# Patient Record
Sex: Female | Born: 1965 | Race: White | Hispanic: No | Smoking: Current every day smoker
Health system: Southern US, Community
[De-identification: ages and names within clinical notes are randomized; demographics above are authoritative.]

## PROBLEM LIST (undated history)

## (undated) DIAGNOSIS — J45909 Unspecified asthma, uncomplicated: Secondary | ICD-10-CM

## (undated) HISTORY — PX: NO PAST SURGERIES: SHX2092

---

## 2005-03-19 ENCOUNTER — Emergency Department: Payer: Self-pay | Admitting: General Practice

## 2005-10-04 ENCOUNTER — Ambulatory Visit: Payer: Self-pay

## 2005-10-22 ENCOUNTER — Observation Stay: Payer: Self-pay

## 2005-11-05 ENCOUNTER — Observation Stay: Payer: Self-pay | Admitting: Obstetrics & Gynecology

## 2005-11-06 ENCOUNTER — Inpatient Hospital Stay: Payer: Self-pay | Admitting: Obstetrics & Gynecology

## 2005-11-14 ENCOUNTER — Emergency Department: Payer: Self-pay | Admitting: Emergency Medicine

## 2007-01-04 ENCOUNTER — Other Ambulatory Visit: Payer: Self-pay

## 2007-01-04 ENCOUNTER — Emergency Department: Payer: Self-pay | Admitting: Emergency Medicine

## 2007-07-13 ENCOUNTER — Emergency Department: Payer: Self-pay | Admitting: Emergency Medicine

## 2008-06-21 ENCOUNTER — Emergency Department: Payer: Self-pay | Admitting: Emergency Medicine

## 2009-12-24 ENCOUNTER — Emergency Department: Payer: Self-pay | Admitting: Emergency Medicine

## 2011-03-03 ENCOUNTER — Emergency Department: Payer: Self-pay | Admitting: Internal Medicine

## 2012-04-23 ENCOUNTER — Emergency Department: Payer: Self-pay | Admitting: Internal Medicine

## 2012-04-23 LAB — CBC
HCT: 43.1 % (ref 35.0–47.0)
Platelet: 333 10*3/uL (ref 150–440)
RBC: 4.81 10*6/uL (ref 3.80–5.20)

## 2012-04-23 LAB — DRUG SCREEN, URINE
Barbiturates, Ur Screen: NEGATIVE (ref ?–200)
Cocaine Metabolite,Ur ~~LOC~~: NEGATIVE (ref ?–300)
MDMA (Ecstasy)Ur Screen: NEGATIVE (ref ?–500)
Methadone, Ur Screen: NEGATIVE (ref ?–300)
Opiate, Ur Screen: NEGATIVE (ref ?–300)
Phencyclidine (PCP) Ur S: NEGATIVE (ref ?–25)

## 2012-04-23 LAB — COMPREHENSIVE METABOLIC PANEL
Albumin: 4.7 g/dL (ref 3.4–5.0)
Alkaline Phosphatase: 82 U/L (ref 50–136)
BUN: 12 mg/dL (ref 7–18)
Bilirubin,Total: 0.5 mg/dL (ref 0.2–1.0)
Calcium, Total: 9.7 mg/dL (ref 8.5–10.1)
Chloride: 105 mmol/L (ref 98–107)
Creatinine: 0.97 mg/dL (ref 0.60–1.30)
EGFR (Non-African Amer.): >60
Osmolality: 273 (ref 275–301)
SGOT(AST): 17 U/L (ref 15–37)
SGPT (ALT): 23 U/L (ref 12–78)
Sodium: 137 mmol/L (ref 136–145)
Total Protein: 8.4 g/dL — ABNORMAL HIGH (ref 6.4–8.2)

## 2012-04-23 LAB — CK TOTAL AND CKMB (NOT AT ARMC): CK-MB: <0.5 ng/mL — ABNORMAL LOW (ref 0.5–3.6)

## 2013-02-08 ENCOUNTER — Other Ambulatory Visit: Payer: Self-pay | Admitting: Podiatry

## 2013-02-08 ENCOUNTER — Telehealth: Payer: Self-pay | Admitting: *Deleted

## 2013-02-08 NOTE — Telephone Encounter (Signed)
Pt called requesting xanax .5 mg

## 2013-02-08 NOTE — Telephone Encounter (Signed)
She can have xanax refill

## 2013-02-09 MED ORDER — ALPRAZOLAM 0.5 MG PO TABS: 0.5000 mg | ORAL_TABLET | Freq: Three times a day (TID) | ORAL | Status: DC | PRN

## 2013-02-09 NOTE — Telephone Encounter (Signed)
Xanax approved by dr. Charlsie Merles

## 2015-03-02 ENCOUNTER — Emergency Department: Payer: Medicare Other

## 2015-03-02 ENCOUNTER — Inpatient Hospital Stay
Admission: EM | Admit: 2015-03-02 | Discharge: 2015-03-04 | DRG: 871 | Disposition: A | Discharge status: Home / Self Care | Payer: Medicare Other | Attending: Internal Medicine | Admitting: Internal Medicine

## 2015-03-02 ENCOUNTER — Encounter: Payer: Self-pay | Admitting: Emergency Medicine

## 2015-03-02 DIAGNOSIS — D72829 Elevated white blood cell count, unspecified: Secondary | ICD-10-CM | POA: Diagnosis not present

## 2015-03-02 DIAGNOSIS — A419 Sepsis, unspecified organism: Principal | ICD-10-CM | POA: Diagnosis present

## 2015-03-02 DIAGNOSIS — J45909 Unspecified asthma, uncomplicated: Secondary | ICD-10-CM | POA: Diagnosis present

## 2015-03-02 DIAGNOSIS — J189 Pneumonia, unspecified organism: Secondary | ICD-10-CM | POA: Diagnosis present

## 2015-03-02 DIAGNOSIS — R000 Tachycardia, unspecified: Secondary | ICD-10-CM

## 2015-03-02 DIAGNOSIS — Z885 Allergy status to narcotic agent status: Secondary | ICD-10-CM | POA: Insufficient documentation

## 2015-03-02 DIAGNOSIS — R509 Fever, unspecified: Secondary | ICD-10-CM

## 2015-03-02 DIAGNOSIS — F17210 Nicotine dependence, cigarettes, uncomplicated: Secondary | ICD-10-CM | POA: Diagnosis present

## 2015-03-02 DIAGNOSIS — M546 Pain in thoracic spine: Secondary | ICD-10-CM | POA: Diagnosis present

## 2015-03-02 DIAGNOSIS — Z886 Allergy status to analgesic agent status: Secondary | ICD-10-CM | POA: Insufficient documentation

## 2015-03-02 DIAGNOSIS — Z23 Encounter for immunization: Secondary | ICD-10-CM | POA: Insufficient documentation

## 2015-03-02 HISTORY — DX: Unspecified asthma, uncomplicated: J45.909

## 2015-03-02 LAB — COMPREHENSIVE METABOLIC PANEL
ALBUMIN: 4.1 g/dL (ref 3.5–5.0)
ALK PHOS: 90 U/L (ref 38–126)
ALT: 12 U/L — AB (ref 14–54)
AST: 14 U/L — AB (ref 15–41)
Anion gap: 11 (ref 5–15)
BILIRUBIN TOTAL: 0.9 mg/dL (ref 0.3–1.2)
BUN: 9 mg/dL (ref 6–20)
CALCIUM: 9.1 mg/dL (ref 8.9–10.3)
CO2: 21 mmol/L — AB (ref 22–32)
CREATININE: 1.11 mg/dL — AB (ref 0.44–1.00)
Chloride: 105 mmol/L (ref 101–111)
GFR calc Af Amer: >60 mL/min (ref >60)
GFR calc non Af Amer: 57 mL/min — ABNORMAL LOW (ref >60)
GLUCOSE: 99 mg/dL (ref 65–99)
Potassium: 4.1 mmol/L (ref 3.5–5.1)
Sodium: 137 mmol/L (ref 135–145)
TOTAL PROTEIN: 7.8 g/dL (ref 6.5–8.1)

## 2015-03-02 LAB — CBC
HCT: 36.8 % (ref 35.0–47.0)
Hemoglobin: 12.2 g/dL (ref 12.0–16.0)
MCH: 29.8 pg (ref 26.0–34.0)
MCHC: 33.1 g/dL (ref 32.0–36.0)
MCV: 90.2 fL (ref 80.0–100.0)
Platelets: 240 10*3/uL (ref 150–440)
RBC: 4.07 MIL/uL (ref 3.80–5.20)
RDW: 13.6 % (ref 11.5–14.5)
WBC: 24.6 10*3/uL — ABNORMAL HIGH (ref 3.6–11.0)

## 2015-03-02 MED ORDER — KETOROLAC TROMETHAMINE 30 MG/ML IJ SOLN
30.0000 mg | Freq: Once | INTRAMUSCULAR | Status: AC
  Administered 2015-03-03: 30 mg via INTRAVENOUS
  Filled 2015-03-02: qty 1

## 2015-03-02 MED ORDER — ACETAMINOPHEN 500 MG PO TABS
1000.0000 mg | ORAL_TABLET | Freq: Once | ORAL | Status: AC
  Administered 2015-03-03: 1000 mg via ORAL
  Filled 2015-03-02: qty 2

## 2015-03-02 MED ORDER — SODIUM CHLORIDE 0.9 % IV BOLUS (SEPSIS)
1000.0000 mL | INTRAVENOUS | Status: AC
  Administered 2015-03-03 (×2): 1000 mL via INTRAVENOUS

## 2015-03-02 MED ORDER — DEXTROSE 5 % IV SOLN
1.0000 g | Freq: Once | INTRAVENOUS | Status: AC
  Administered 2015-03-03: 1 g via INTRAVENOUS
  Filled 2015-03-02: qty 10

## 2015-03-02 MED ORDER — SODIUM CHLORIDE 0.9 % IV BOLUS (SEPSIS)
1000.0000 mL | Freq: Once | INTRAVENOUS | Status: AC
  Administered 2015-03-03: 1000 mL via INTRAVENOUS

## 2015-03-02 MED ORDER — DEXTROSE 5 % IV SOLN: 1.0000 g | Freq: Once | INTRAVENOUS | Status: DC

## 2015-03-02 MED ORDER — HYDROCOD POLST-CPM POLST ER 10-8 MG/5ML PO SUER
5.0000 mL | Freq: Once | ORAL | Status: AC
Start: 2015-03-03 — End: 2015-03-03
  Administered 2015-03-03: 5 mL via ORAL
  Filled 2015-03-02: qty 5

## 2015-03-02 MED ORDER — DEXTROSE 5 % IV SOLN
500.0000 mg | Freq: Once | INTRAVENOUS | Status: AC
  Administered 2015-03-03: 500 mg via INTRAVENOUS
  Filled 2015-03-02: qty 500

## 2015-03-02 NOTE — ED Provider Notes (Signed)
[facility] Emergency Department Provider Note  ____________________________________________  Time seen: Approximately 11:18 PM  I have reviewed the triage vital signs and the nursing notes.   HISTORY  Chief Complaint Back Pain    HPI Cindy Carrillo is a 50 y.o. female who presents to the ED from home with a chief complaint of fever and back pain. Patient notes left upper thoracic pain times one week, worsening over the past 2 days. States she usually has her young granddaughter step on her back and is not sure if her symptoms are related to that. Denies recent travel or trauma. Notes nonproductive cough and fever/chills which started yesterday. Denies associated chest pain, abdominal pain, nausea, vomiting, diarrhea, dysuria. Nothing makes her pain better. Movement and deep inspiration make her pain worse.   Past medical history None  There are no active problems to display for this patient.   Past surgical history None  No current outpatient prescriptions on file.  Allergies Vicodin and Percocet  History reviewed. No pertinent family history.  Social History Social History  Substance Use Topics  . Smoking status: Current Every Day Smoker -- 0.50 packs/day  . Smokeless tobacco: None  . Alcohol Use: No    Review of Systems Constitutional: Positive for fever/chills Eyes: No visual changes. ENT: No sore throat. Cardiovascular: Denies chest pain. Respiratory: Positive for shortness of breath. Gastrointestinal: No abdominal pain.  No nausea, no vomiting.  No diarrhea.  No constipation. Genitourinary: Negative for dysuria. Musculoskeletal: Positive for back pain. Skin: Negative for rash. Neurological: Negative for headaches, focal weakness or numbness.  10-point ROS otherwise negative.  ____________________________________________   PHYSICAL EXAM:  VITAL SIGNS: ED Triage Vitals  Enc Vitals Group     BP 03/02/15 2225 147/86 mmHg      Pulse Rate 03/02/15 2225 119     Resp 03/02/15 2225 22     Temp 03/02/15 2225 102.4 F (39.1 C)     Temp src --      SpO2 --      Weight 03/02/15 2225 195 lb (88.451 kg)     Height 03/02/15 2225 5\' 3"  (1.6 m)     Head Cir --      Peak Flow --      Pain Score 03/02/15 2228 10     Pain Loc --      Pain Edu? --      Excl. in GC? --     Constitutional: Alert and oriented. Well appearing and in moderate acute distress. Eyes: Conjunctivae are normal. PERRL. EOMI. Head: Atraumatic. Nose: No congestion/rhinnorhea. Mouth/Throat: Mucous membranes are moist.  Oropharynx non-erythematous. Neck: No stridor.   Hematological/Lymphatic/Immunilogical: No cervical lymphadenopathy. Cardiovascular: Tachycardic rate, regular rhythm. Grossly normal heart sounds.  Good peripheral circulation. Respiratory: Increased respiratory effort.  Splinting.  No retractions. Lungs with scattered rhonchi. Gastrointestinal: Soft and nontender. No distention. No abdominal bruits. No CVA tenderness. Musculoskeletal: Left upper thoracic back tender to palpation. There is a red line of demarcation from patient's heating pad. No spinal tenderness. No lower extremity tenderness nor edema.  No joint effusions. Neurologic:  Normal speech and language. No gross focal neurologic deficits are appreciated. No gait instability. Skin:  Skin is warm, dry and intact. No rash noted. Specifically, no petechiae. Psychiatric: Mood and affect are normal. Speech and behavior are normal.  ____________________________________________   LABS (all labs ordered are listed, but only abnormal results are displayed)  Labs Reviewed  COMPREHENSIVE METABOLIC PANEL - Abnormal; Notable for the  following:    CO2 21 (*)    Creatinine, Ser 1.11 (*)    AST 14 (*)    ALT 12 (*)    GFR calc non Af Amer 57 (*)    All other components within normal limits  CBC - Abnormal; Notable for the following:    WBC 24.6 (*)    All other components within  normal limits  CULTURE, BLOOD (ROUTINE X 2)  CULTURE, BLOOD (ROUTINE X 2)  URINE CULTURE  URINALYSIS COMPLETEWITH MICROSCOPIC ([facility] ONLY)  LACTIC ACID, PLASMA  LACTIC ACID, PLASMA   ____________________________________________  EKG  ED ECG REPORT I, Hans Rusher J, the attending physician, personally viewed and interpreted this ECG.   Date: 03/03/2015  EKG Time: 2228  Rate: 116  Rhythm: sinus tachycardia  Axis: Normal  Intervals:none  ST&T Change: Nonspecific  ____________________________________________  RADIOLOGY  Chest 2 view (viewed by me, interpreted per Dr. Gwenyth Benderadparvar): Right upper lobe and left suprahilar opacities concerning for pneumonia. Clinical correlation and follow-up recommended. ____________________________________________   PROCEDURES  Procedure(s) performed: None  Critical Care performed: No  ____________________________________________   INITIAL IMPRESSION / ASSESSMENT AND PLAN / ED COURSE  Pertinent labs & imaging results that were available during my care of the patient were reviewed by me and considered in my medical decision making (see chart for details).  50 year old female who presents with fever and upper back pain. Laboratory results notable for leukocytosis; chest x-ray concerning for bilateral pneumonia. ED code sepsis initiated. Will administer Tylenol for fever, IV Rocephin and azithromycin for pneumonia. ____________________________________________   FINAL CLINICAL IMPRESSION(S) / ED DIAGNOSES  Final diagnoses:  Fever, unspecified fever cause  Community acquired pneumonia  Left-sided thoracic back pain  Tachycardia  Leukocytosis      Irean HongJade J Librado Guandique, MD 03/03/15 [phone]

## 2015-03-02 NOTE — Progress Notes (Signed)
ANTIBIOTIC CONSULT NOTE - INITIAL  Pharmacy Consult for azithromycin/ceftriaxone Indication: pneumonia  No Known Allergies  Patient Measurements: Height: 5\' 3"  (160 cm) Weight: 195 lb (88.451 kg) IBW/kg (Calculated) : 52.4 Adjusted Body Weight:   Vital Signs: Temp: 102.4 F (39.1 C) (01/08 2225) BP: 147/86 mmHg (01/08 2225) Pulse Rate: 119 (01/08 2225) Intake/Output from previous day:   Intake/Output from this shift:    Labs:  Recent Labs  03/02/15 2233  WBC 24.6*  HGB 12.2  PLT 240  CREATININE 1.11*   Estimated Creatinine Clearance: 63.9 mL/min (by C-G formula based on Cr of 1.11). No results for input(s): VANCOTROUGH, VANCOPEAK, VANCORANDOM, GENTTROUGH, GENTPEAK, GENTRANDOM, TOBRATROUGH, TOBRAPEAK, TOBRARND, AMIKACINPEAK, AMIKACINTROU, AMIKACIN in the last 72 hours.   Microbiology: No results found for this or any previous visit (from the past 720 hour(s)).  Medical History: History reviewed. No pertinent past medical history.  Medications:  Infusions:  . [START ON 03/03/2015] azithromycin    . [START ON 03/03/2015] cefTRIAXone (ROCEPHIN)  IV    . [START ON 03/03/2015] cefTRIAXone (ROCEPHIN)  IV    . sodium chloride    . [START ON 03/03/2015] sodium chloride     Assessment: 49 yof with back pain and fever. RN notes some difficulty speaking but pt denies SOB. Returned from CXR and provider consults pharmacy for dosing azithromycin/ceftriaxone - CAP.  Goal of Therapy:    Plan:  Azithromycin 500 mg IV Q24H and ceftriaxone 2 gm IV Q24H. Pharmacy will continue to follow and adjust to PO when clinically appropriate.  Carola FrostNathan A Lysha Schrade, Pharm.D., BCPS Clinical Pharmacist 03/02/2015,11:58 PM

## 2015-03-02 NOTE — ED Notes (Signed)
Pt returned to room from xray.

## 2015-03-02 NOTE — ED Notes (Signed)
Back pain with fever. Talking in short bursts but denies being sob.

## 2015-03-03 ENCOUNTER — Encounter: Payer: Self-pay | Admitting: Internal Medicine

## 2015-03-03 DIAGNOSIS — D72829 Elevated white blood cell count, unspecified: Secondary | ICD-10-CM | POA: Diagnosis present

## 2015-03-03 DIAGNOSIS — M546 Pain in thoracic spine: Secondary | ICD-10-CM | POA: Diagnosis not present

## 2015-03-03 DIAGNOSIS — J189 Pneumonia, unspecified organism: Secondary | ICD-10-CM | POA: Diagnosis present

## 2015-03-03 DIAGNOSIS — Z23 Encounter for immunization: Secondary | ICD-10-CM | POA: Diagnosis not present

## 2015-03-03 DIAGNOSIS — Z885 Allergy status to narcotic agent status: Secondary | ICD-10-CM | POA: Diagnosis not present

## 2015-03-03 DIAGNOSIS — A419 Sepsis, unspecified organism: Secondary | ICD-10-CM | POA: Diagnosis present

## 2015-03-03 DIAGNOSIS — J45909 Unspecified asthma, uncomplicated: Secondary | ICD-10-CM | POA: Diagnosis present

## 2015-03-03 DIAGNOSIS — R000 Tachycardia, unspecified: Secondary | ICD-10-CM | POA: Diagnosis not present

## 2015-03-03 DIAGNOSIS — F17210 Nicotine dependence, cigarettes, uncomplicated: Secondary | ICD-10-CM | POA: Diagnosis not present

## 2015-03-03 DIAGNOSIS — Z886 Allergy status to analgesic agent status: Secondary | ICD-10-CM | POA: Diagnosis not present

## 2015-03-03 LAB — CBC
HEMATOCRIT: 33.3 % — AB (ref 35.0–47.0)
HEMOGLOBIN: 11.1 g/dL — AB (ref 12.0–16.0)
MCH: 30.1 pg (ref 26.0–34.0)
MCHC: 33.3 g/dL (ref 32.0–36.0)
MCV: 90.4 fL (ref 80.0–100.0)
Platelets: 214 10*3/uL (ref 150–440)
RBC: 3.68 MIL/uL — ABNORMAL LOW (ref 3.80–5.20)
RDW: 13.6 % (ref 11.5–14.5)
WBC: 19.1 10*3/uL — ABNORMAL HIGH (ref 3.6–11.0)

## 2015-03-03 LAB — URINALYSIS COMPLETE WITH MICROSCOPIC (ARMC ONLY)
Bilirubin Urine: NEGATIVE
GLUCOSE, UA: NEGATIVE mg/dL
Leukocytes, UA: NEGATIVE
NITRITE: NEGATIVE
PH: 8 (ref 5.0–8.0)
Protein, ur: 100 mg/dL — AB
Specific Gravity, Urine: 1.013 (ref 1.005–1.030)

## 2015-03-03 LAB — BASIC METABOLIC PANEL
ANION GAP: 7 (ref 5–15)
BUN: 7 mg/dL (ref 6–20)
CO2: 22 mmol/L (ref 22–32)
Calcium: 8 mg/dL — ABNORMAL LOW (ref 8.9–10.3)
Chloride: 111 mmol/L (ref 101–111)
Creatinine, Ser: 0.95 mg/dL (ref 0.44–1.00)
GFR calc Af Amer: >60 mL/min (ref >60)
GLUCOSE: 110 mg/dL — AB (ref 65–99)
POTASSIUM: 3.7 mmol/L (ref 3.5–5.1)
Sodium: 140 mmol/L (ref 135–145)

## 2015-03-03 LAB — LACTIC ACID, PLASMA
LACTIC ACID, VENOUS: 0.7 mmol/L (ref 0.5–2.0)
Lactic Acid, Venous: 0.5 mmol/L (ref 0.5–2.0)

## 2015-03-03 MED ORDER — CEFTRIAXONE SODIUM 2 G IJ SOLR
2.0000 g | INTRAMUSCULAR | Status: DC
  Administered 2015-03-03: 2 g via INTRAVENOUS
  Filled 2015-03-03 (×2): qty 2

## 2015-03-03 MED ORDER — ONDANSETRON HCL 4 MG PO TABS: 4.0000 mg | ORAL_TABLET | Freq: Four times a day (QID) | ORAL | Status: DC | PRN

## 2015-03-03 MED ORDER — ALBUTEROL SULFATE (2.5 MG/3ML) 0.083% IN NEBU
2.5000 mg | INHALATION_SOLUTION | RESPIRATORY_TRACT | Status: DC | PRN
  Administered 2015-03-03 (×2): 2.5 mg via RESPIRATORY_TRACT
  Filled 2015-03-03 (×2): qty 3

## 2015-03-03 MED ORDER — KETOROLAC TROMETHAMINE 30 MG/ML IJ SOLN
30.0000 mg | Freq: Four times a day (QID) | INTRAMUSCULAR | Status: DC | PRN
  Administered 2015-03-03: 30 mg via INTRAVENOUS
  Filled 2015-03-03: qty 1

## 2015-03-03 MED ORDER — INFLUENZA VAC SPLIT QUAD 0.5 ML IM SUSY: 0.5000 mL | PREFILLED_SYRINGE | INTRAMUSCULAR | Status: DC

## 2015-03-03 MED ORDER — PNEUMOCOCCAL VAC POLYVALENT 25 MCG/0.5ML IJ INJ
0.5000 mL | INJECTION | INTRAMUSCULAR | Status: AC
  Administered 2015-03-04: 0.5 mL via INTRAMUSCULAR
  Filled 2015-03-03: qty 0.5

## 2015-03-03 MED ORDER — SODIUM CHLORIDE 0.9 % IJ SOLN
3.0000 mL | Freq: Two times a day (BID) | INTRAMUSCULAR | Status: DC
  Administered 2015-03-03 – 2015-03-04 (×4): 3 mL via INTRAVENOUS

## 2015-03-03 MED ORDER — ENOXAPARIN SODIUM 40 MG/0.4ML ~~LOC~~ SOLN
40.0000 mg | Freq: Every day | SUBCUTANEOUS | Status: DC
  Administered 2015-03-03 – 2015-03-04 (×2): 40 mg via SUBCUTANEOUS
  Filled 2015-03-03 (×2): qty 0.4

## 2015-03-03 MED ORDER — ACETAMINOPHEN 650 MG RE SUPP: 650.0000 mg | Freq: Four times a day (QID) | RECTAL | Status: DC | PRN

## 2015-03-03 MED ORDER — ONDANSETRON HCL 4 MG/2ML IJ SOLN
4.0000 mg | Freq: Four times a day (QID) | INTRAMUSCULAR | Status: DC | PRN
  Administered 2015-03-03: 4 mg via INTRAVENOUS
  Filled 2015-03-03: qty 2

## 2015-03-03 MED ORDER — GUAIFENESIN-DM 100-10 MG/5ML PO SYRP
5.0000 mL | ORAL_SOLUTION | ORAL | Status: DC | PRN
  Administered 2015-03-03: 5 mL via ORAL
  Filled 2015-03-03: qty 5

## 2015-03-03 MED ORDER — ACETAMINOPHEN 325 MG PO TABS
650.0000 mg | ORAL_TABLET | Freq: Four times a day (QID) | ORAL | Status: DC | PRN
Start: 2015-03-03 — End: 2015-03-04
  Administered 2015-03-03 – 2015-03-04 (×3): 650 mg via ORAL
  Filled 2015-03-03 (×3): qty 2

## 2015-03-03 MED ORDER — SODIUM CHLORIDE 0.9 % IV SOLN
INTRAVENOUS | Status: AC
  Administered 2015-03-03: 03:00:00 via INTRAVENOUS

## 2015-03-03 MED ORDER — DEXTROSE 5 % IV SOLN
500.0000 mg | INTRAVENOUS | Status: DC
  Administered 2015-03-03: 500 mg via INTRAVENOUS
  Filled 2015-03-03 (×2): qty 500

## 2015-03-03 MED ORDER — TRAMADOL HCL 50 MG PO TABS
50.0000 mg | ORAL_TABLET | Freq: Three times a day (TID) | ORAL | Status: DC | PRN
  Administered 2015-03-03 – 2015-03-04 (×2): 50 mg via ORAL
  Filled 2015-03-03 (×2): qty 1

## 2015-03-03 MED ORDER — DEXTROSE 5 % IV SOLN
INTRAVENOUS | Status: AC
  Administered 2015-03-03: 1 g via INTRAVENOUS
  Filled 2015-03-03: qty 10

## 2015-03-03 MED ORDER — DEXTROSE 5 % IV SOLN
1.0000 g | Freq: Once | INTRAVENOUS | Status: AC
  Administered 2015-03-03: 1 g via INTRAVENOUS

## 2015-03-03 NOTE — H&P (Addendum)
[facility] [place] [facility]   PATIENT NAME: Cindy Carrillo    MR#:  [id number]  DATE OF BIRTH:  03/27/1965  DATE OF ADMISSION:  03/02/2015  PRIMARY CARE PHYSICIAN: No PCP Per Patient   REQUESTING/REFERRING PHYSICIAN: Dolores FrameSung, M.D.  CHIEF COMPLAINT:   Chief Complaint  Patient presents with  . Back Pain    HISTORY OF PRESENT ILLNESS:  Cindy Carrillo  is a 50 y.o. female who presents with left shoulder/back pain. Patient states that she thought she had pulled her injured her back in that area, but that she had this persistent pain and then began to develop cough as well. Pain was worse when she would take a deep breath. She came in for evaluation was found to have a left upper lobe pneumonia. She was also found to be septic with an elevated white count, tachycardic, tachypneic. Hospitalists were called for admission for sepsis and community acquired pneumonia.  PAST MEDICAL HISTORY:   Past Medical History  Diagnosis Date  . Asthma     PAST SURGICAL HISTORY:   Past Surgical History  Procedure Laterality Date  . No past surgeries      SOCIAL HISTORY:   Social History  Substance Use Topics  . Smoking status: Current Every Day Smoker -- 0.50 packs/day  . Smokeless tobacco: Not on file  . Alcohol Use: No    FAMILY HISTORY:  History reviewed. No pertinent family history.   DRUG ALLERGIES:   Allergies  Allergen Reactions  . Vicodin [Hydrocodone-Acetaminophen] Nausea And Vomiting  . Percocet [Oxycodone-Acetaminophen] Palpitations    MEDICATIONS AT HOME:   Prior to Admission medications   Not on File    REVIEW OF SYSTEMS:  Review of Systems  Constitutional: Positive for malaise/fatigue. Negative for fever, chills and weight loss.  HENT: Negative for ear pain, hearing loss and tinnitus.   Eyes: Negative for blurred vision, double vision, pain and redness.  Respiratory: Positive for cough and wheezing. Negative for  hemoptysis and shortness of breath.   Cardiovascular: Negative for chest pain, palpitations, orthopnea and leg swelling.  Gastrointestinal: Negative for nausea, vomiting, abdominal pain, diarrhea and constipation.  Genitourinary: Negative for dysuria, frequency and hematuria.  Musculoskeletal: Positive for back pain (left upper back) and joint pain (left shoulder). Negative for neck pain.  Skin:       No acne, rash, or lesions  Neurological: Negative for dizziness, tremors, focal weakness and weakness.  Endo/Heme/Allergies: Negative for polydipsia. Does not bruise/bleed easily.  Psychiatric/Behavioral: Negative for depression. The patient is not nervous/anxious and does not have insomnia.      VITAL SIGNS:   Filed Vitals:   03/02/15 2225 03/02/15 2359  BP: 147/86 130/66  Pulse: 119 105  Temp: 102.4 F (39.1 C) 99.4 F (37.4 C)  TempSrc:  Oral  Resp: 22 22  Height: 5\' 3"  (1.6 m)   Weight: 88.451 kg (195 lb)   SpO2:  98%   Wt Readings from Last 3 Encounters:  03/02/15 88.451 kg (195 lb)    PHYSICAL EXAMINATION:  Physical Exam  Vitals reviewed. Constitutional: She is oriented to person, place, and time. She appears well-developed and well-nourished. No distress.  HENT:  Head: Normocephalic and atraumatic.  Mouth/Throat: Oropharynx is clear and moist.  Eyes: Conjunctivae and EOM are normal. Pupils are equal, round, and reactive to light. No scleral icterus.  Neck: Normal range of motion. Neck supple. No JVD present. No thyromegaly present.  Cardiovascular: Normal rate, regular rhythm and  intact distal pulses.  Exam reveals no gallop and no friction rub.   No murmur heard. Respiratory: She is in respiratory distress (mild). She has wheezes (Left upper lobe). She has no rales.  GI: Soft. Bowel sounds are normal. She exhibits no distension. There is no tenderness.  Musculoskeletal: Normal range of motion. She exhibits no edema.  No arthritis, no gout  Lymphadenopathy:    She  has no cervical adenopathy.  Neurological: She is alert and oriented to person, place, and time. No cranial nerve deficit.  No dysarthria, no aphasia  Skin: Skin is warm and dry. No rash noted. No erythema.  Psychiatric: She has a normal mood and affect. Her behavior is normal. Judgment and thought content normal.    LABORATORY PANEL:   CBC  Recent Labs Lab 03/02/15 2233  WBC 24.6*  HGB 12.2  HCT 36.8  PLT 240   ------------------------------------------------------------------------------------------------------------------  Chemistries   Recent Labs Lab 03/02/15 2233  NA 137  K 4.1  CL 105  CO2 21*  GLUCOSE 99  BUN 9  CREATININE 1.11*  CALCIUM 9.1  AST 14*  ALT 12*  ALKPHOS 90  BILITOT 0.9   ------------------------------------------------------------------------------------------------------------------  Cardiac Enzymes No results for input(s): TROPONINI in the last 168 hours. ------------------------------------------------------------------------------------------------------------------  RADIOLOGY:  Dg Chest 2 View  03/02/2015  CLINICAL DATA:  50 year old female with cough and fever and back pain. Chest radiograph dated 05/11/2012 EXAM: CHEST  2 VIEW COMPARISON:  None. FINDINGS: Two views of the chest demonstrates a focal area of increased density in the right upper lobe. Left suprahilar opacity is also noted. Left lung base linear density may represent atelectasis/ pneumonia. No significant pleural effusion. There is no pneumothorax. The cardiac silhouette is within normal limits. The osseous structures appear unremarkable. IMPRESSION: Right upper lobe and left suprahilar opacities concerning for pneumonia. Clinical correlation and follow-up recommended. Electronically Signed   By: Elgie Collard M.D.   On: 03/02/2015 23:42    EKG:   Orders placed or performed during the hospital encounter of 03/02/15  . ED EKG 12-Lead  . ED EKG 12-Lead    IMPRESSION  AND PLAN:  Principal Problem:   Sepsis (HCC) - broad spectrum antibiotics started in the ED, continues on admission. Chest x-ray consistent with community acquired pneumonia as a source. Blood cultures sent from the ED, sputum culture ordered. Patient is hemodynamically stable. Lactic acid pending. Active Problems:   CAP (community acquired pneumonia) - antibiotics and cultures as above.   Asthma - albuterol nebs when necessary   Leukocytosis - due to infection. Will monitor for improvement as her infection improves.  All the records are reviewed and case discussed with ED provider. Management plans discussed with the patient and/or family.  DVT PROPHYLAXIS: SubQ lovenox  GI PROPHYLAXIS: None  ADMISSION STATUS: Inpatient  CODE STATUS: Full  TOTAL TIME TAKING CARE OF THIS PATIENT: 40 minutes.    Haydin Dunn FIELDING 03/03/2015, 12:42 AM  [facility]  Office  [phone]  CC: Primary care physician; No PCP Per Patient

## 2015-03-03 NOTE — Progress Notes (Signed)
Called Dr. Elisabeth PigeonVachhani to get patient something extra for back pain.  He gave me a verbal order.

## 2015-03-03 NOTE — ED Notes (Addendum)
Dr Anne HahnWillis in to see pt; pt upset, crying while talking on phone with family

## 2015-03-03 NOTE — Progress Notes (Addendum)
[facility]   PATIENT NAME: Cindy Carrillo    MR#:  [id number]  DATE OF BIRTH:  02/13/1966  SUBJECTIVE:  Feels weak but overall better  REVIEW OF SYSTEMS:   Review of Systems  Constitutional: Negative for fever, chills and weight loss.  HENT: Negative for ear discharge, ear pain and nosebleeds.   Eyes: Negative for blurred vision, pain and discharge.  Respiratory: Positive for cough. Negative for sputum production, shortness of breath, wheezing and stridor.   Cardiovascular: Negative for chest pain, palpitations, orthopnea and PND.  Gastrointestinal: Negative for nausea, vomiting, abdominal pain and diarrhea.  Genitourinary: Negative for urgency and frequency.  Musculoskeletal: Positive for back pain. Negative for joint pain.  Neurological: Positive for weakness. Negative for sensory change, speech change and focal weakness.  Psychiatric/Behavioral: Negative for depression and hallucinations. The patient is not nervous/anxious.   All other systems reviewed and are negative.  Tolerating Diet:yes Tolerating PT: not needed  DRUG ALLERGIES:   Allergies  Allergen Reactions  . Vicodin [Hydrocodone-Acetaminophen] Nausea And Vomiting  . Percocet [Oxycodone-Acetaminophen] Palpitations    VITALS:  Blood pressure 105/56, pulse 96, temperature 97.8 F (36.6 C), temperature source Oral, resp. rate 20, height 5\' 3"  (1.6 m), weight 88.451 kg (195 lb), SpO2 97 %.  PHYSICAL EXAMINATION:   Physical Exam  GENERAL:  50 y.o.-year-old patient lying in the bed with no acute distress.  EYES: Pupils equal, round, reactive to light and accommodation. No scleral icterus. Extraocular muscles intact.  HEENT: Head atraumatic, normocephalic. Oropharynx and nasopharynx clear.  NECK:  Supple, no jugular venous distention. No thyroid enlargement, no tenderness.  LUNGS: Normal breath sounds bilaterally, no wheezing, rales, rhonchi. No use of  accessory muscles of respiration.  CARDIOVASCULAR: S1, S2 normal. No murmurs, rubs, or gallops.  ABDOMEN: Soft, nontender, nondistended. Bowel sounds present. No organomegaly or mass.  EXTREMITIES: No cyanosis, clubbing or edema b/l.    NEUROLOGIC: Cranial nerves II through XII are intact. No focal Motor or sensory deficits b/l.   PSYCHIATRIC: The patient is alert and oriented x 3.  SKIN: No obvious rash, lesion, or ulcer.   LABORATORY PANEL:  CBC  Recent Labs Lab 03/03/15 0339  WBC 19.1*  HGB 11.1*  HCT 33.3*  PLT 214    Chemistries   Recent Labs Lab 03/02/15 2233 03/03/15 0339  NA 137 140  K 4.1 3.7  CL 105 111  CO2 21* 22  GLUCOSE 99 110*  BUN 9 7  CREATININE 1.11* 0.95  CALCIUM 9.1 8.0*  AST 14*  --   ALT 12*  --   ALKPHOS 90  --   BILITOT 0.9  --     Cardiac Enzymes No results for input(s): TROPONINI in the last 168 hours.  RADIOLOGY:  Dg Chest 2 View  03/02/2015  CLINICAL DATA:  50 year old female with cough and fever and back pain. Chest radiograph dated 05/11/2012 EXAM: CHEST  2 VIEW COMPARISON:  None. FINDINGS: Two views of the chest demonstrates a focal area of increased density in the right upper lobe. Left suprahilar opacity is also noted. Left lung base linear density may represent atelectasis/ pneumonia. No significant pleural effusion. There is no pneumothorax. The cardiac silhouette is within normal limits. The osseous structures appear unremarkable. IMPRESSION: Right upper lobe and left suprahilar opacities concerning for pneumonia. Clinical correlation and follow-up recommended. Electronically Signed   By: Elgie CollardArash  Radparvar M.D.   On: 03/02/2015 23:42    ASSESSMENT AND PLAN:  Cindy Carrillo is a 49 y.o. female who presents with left shoulder/back pain. Patient states that she thought she had pulled her injured her back in that area, but that she had this persistent pain and then began to develop cough as well. Pain was worse when she would  take a deep breath. She came in for evaluation was found to have a left upper lobe pneumonia.   1.Sepsis (HCC) - broad spectrum antibiotics  - Chest x-ray consistent with community acquired pneumonia as a source. - Blood cultures negative so far  2.CAP (community acquired pneumonia) - antibiotics and cultures as above.  3. Asthma - albuterol nebs when necessary   Case discussed with Care Management/Social Worker. Management plans discussed with the patient, family and they are in agreement.  CODE STATUS: *full  DVT Prophylaxis: lovenox  TOTAL TIME TAKING CARE OF THIS PATIENT: 30 minutes.  >50% time spent on counselling and coordination of care  POSSIBLE D/C IN 1 DAYS, DEPENDING ON CLINICAL CONDITION.  Note: This dictation was prepared with Dragon dictation along with smaller phrase technology. Any transcriptional errors that result from this process are unintentional.  Sheniya Garciaperez M.D on 03/03/2015 at 5:11 PM  Between 7am to 6pm - Pager - [phone]  After 6pm go to [email or web address] - password [email or web address] [facility]  [facility]  Office  [phone]  CC: Primary care physician; No PCP Per Patient

## 2015-03-03 NOTE — Progress Notes (Signed)
Called Dr. Allena KatzPatel about discontinuing the telemetry monitor.  She gave to order to discontinue it.

## 2015-03-03 NOTE — ED Notes (Signed)
Pt calm, resting quietly on stretcher.

## 2015-03-03 NOTE — Progress Notes (Signed)
A/O.  VSS.  Patient having some upper back pain that has been controlled with Tylenol and Toradol.  Lungs are still diminished with expiratory wheezes.  Tolerating her diet well.  Voiding adequately.  No BM today.  No significant changes.  Continue to monitor.

## 2015-03-03 NOTE — Progress Notes (Signed)
   03/03/15 1100  Clinical Encounter Type  Visited With Patient not available  Visit Type Initial  Referral From Nurse  Consult/Referral To Chaplain  Spiritual Encounters  Spiritual Needs Prayer  Chaplain attempted to visit with patient but patient was unavailable (on the phone). Will try again later. Chaplain Atiana Levier A. Charnita Trudel Ext. [phone]

## 2015-03-03 NOTE — Progress Notes (Signed)
   03/03/15 1158  Clinical Encounter Type  Visited With Patient  Visit Type Follow-up;Spiritual support  Referral From Nurse  Consult/Referral To Chaplain  Spiritual Encounters  Spiritual Needs Emotional  Stress Factors  Patient Stress Factors Health changes  Chaplain went back to visit with patient. Patient discussed feelings of health and concern. I offered a compassionate presence and support. Patient seemed appreciative but did not have an expressed need at this time. Chaplain Gerhart Ruggieri A. Teriyah Purington Ext. [phone]

## 2015-03-03 NOTE — ED Notes (Signed)
Dr Dolores FrameSung at bedside to instruct pt on plan of care and dx; pt voices understanding; pt also informed that she would need to have someone p/u her young daughter as she would not be able to stay in hospital with her per nursing supervisor; pt voices good understanding & currently on phone with her elder daughter to p/u her youngest

## 2015-03-04 DIAGNOSIS — A419 Sepsis, unspecified organism: Secondary | ICD-10-CM | POA: Diagnosis not present

## 2015-03-04 LAB — CBC
HEMATOCRIT: 33.4 % — AB (ref 35.0–47.0)
Hemoglobin: 11.1 g/dL — ABNORMAL LOW (ref 12.0–16.0)
MCH: 29.6 pg (ref 26.0–34.0)
MCHC: 33.2 g/dL (ref 32.0–36.0)
MCV: 89.2 fL (ref 80.0–100.0)
PLATELETS: 219 10*3/uL (ref 150–440)
RBC: 3.75 MIL/uL — ABNORMAL LOW (ref 3.80–5.20)
RDW: 13.8 % (ref 11.5–14.5)
WBC: 14.2 10*3/uL — AB (ref 3.6–11.0)

## 2015-03-04 LAB — EXPECTORATED SPUTUM ASSESSMENT W GRAM STAIN, RFLX TO RESP C

## 2015-03-04 LAB — URINE CULTURE: SPECIAL REQUESTS: NORMAL

## 2015-03-04 LAB — EXPECTORATED SPUTUM ASSESSMENT W REFEX TO RESP CULTURE

## 2015-03-04 MED ORDER — AZITHROMYCIN 250 MG PO TABS: 250.0000 mg | ORAL_TABLET | Freq: Every day | ORAL | Status: DC

## 2015-03-04 MED ORDER — TRAMADOL HCL 50 MG PO TABS: 50.0000 mg | ORAL_TABLET | Freq: Three times a day (TID) | ORAL | Status: AC | PRN

## 2015-03-04 MED ORDER — ALBUTEROL SULFATE HFA 108 (90 BASE) MCG/ACT IN AERS: 2.0000 | INHALATION_SPRAY | Freq: Four times a day (QID) | RESPIRATORY_TRACT | Status: DC | PRN

## 2015-03-04 MED ORDER — CEPHALEXIN 500 MG PO CAPS
500.0000 mg | ORAL_CAPSULE | Freq: Two times a day (BID) | ORAL | Status: DC
  Administered 2015-03-04: 500 mg via ORAL
  Filled 2015-03-04: qty 1

## 2015-03-04 MED ORDER — AZITHROMYCIN 250 MG PO TABS: ORAL_TABLET | ORAL | Status: AC

## 2015-03-04 MED ORDER — CEPHALEXIN 500 MG PO CAPS: 500.0000 mg | ORAL_CAPSULE | Freq: Two times a day (BID) | ORAL | Status: AC

## 2015-03-04 MED ORDER — GUAIFENESIN-DM 100-10 MG/5ML PO SYRP: 5.0000 mL | ORAL_SOLUTION | ORAL | Status: AC | PRN

## 2015-03-04 NOTE — Discharge Summary (Signed)
[facility] [place] [facility]   PATIENT NAME: Cindy Carrillo    MR#:  [id number]  DATE OF BIRTH:  05/24/1965  DATE OF ADMISSION:  03/02/2015 ADMITTING PHYSICIAN: Oralia Manisavid Willis, MD  DATE OF DISCHARGE:   PRIMARY CARE PHYSICIAN: No PCP Per Patient    ADMISSION DIAGNOSIS:  Leukocytosis [D72.829] Tachycardia [R00.0] Community acquired pneumonia [J18.9] Left-sided thoracic back pain [M54.6] Fever, unspecified fever cause [R50.9]  DISCHARGE DIAGNOSIS:  Right sided pneumonia Ashtma  SECONDARY DIAGNOSIS:   Past Medical History  Diagnosis Date  . Asthma     HOSPITAL COURSE:   Cindy Carrillo is a 50 y.o. female who presents with left shoulder/back pain. Patient states that she thought she had pulled her injured her back in that area, but that she had this persistent pain and then began to develop cough as well. Pain was worse when she would take a deep breath. She came in for evaluation was found to have a left upper lobe pneumonia.   1.Sepsis (HCC) - broad spectrum antibiotics  - Chest x-ray consistent with community acquired pneumonia as a source. - Blood cultures negative so far -change to po keflex and zithromax -wbc down from 27K to 14 K. Afebrile. Feels better. sats >92% on RA  2.CAP (community acquired pneumonia) - antibiotics and cultures as above.  3. Asthma - albuterol nebs when necessary  D/c home CONSULTS OBTAINED:     DRUG ALLERGIES:   Allergies  Allergen Reactions  . Vicodin [Hydrocodone-Acetaminophen] Nausea And Vomiting  . Percocet [Oxycodone-Acetaminophen] Palpitations    DISCHARGE MEDICATIONS:   Current Discharge Medication List    START taking these medications   Details  albuterol (PROVENTIL HFA;VENTOLIN HFA) 108 (90 Base) MCG/ACT inhaler Inhale 2 puffs into the lungs every 6 (six) hours as needed for wheezing or shortness of breath. Qty: 1 Inhaler, Refills: 2    azithromycin (ZITHROMAX) 250 MG  tablet Take 1 tab daily Qty: 5 each, Refills: 0    cephALEXin (KEFLEX) 500 MG capsule Take 1 capsule (500 mg total) by mouth every 12 (twelve) hours. Qty: 10 capsule, Refills: 0    guaiFENesin-dextromethorphan (ROBITUSSIN DM) 100-10 MG/5ML syrup Take 5 mLs by mouth every 4 (four) hours as needed for cough. Qty: 118 mL, Refills: 0    traMADol (ULTRAM) 50 MG tablet Take 1 tablet (50 mg total) by mouth every 8 (eight) hours as needed for moderate pain. Qty: 30 tablet, Refills: 0        If you experience worsening of your admission symptoms, develop shortness of breath, life threatening emergency, suicidal or homicidal thoughts you must seek medical attention immediately by calling 911 or calling your MD immediately  if symptoms less severe.  You Must read complete instructions/literature along with all the possible adverse reactions/side effects for all the Medicines you take and that have been prescribed to you. Take any new Medicines after you have completely understood and accept all the possible adverse reactions/side effects.   Please note  You were cared for by a hospitalist during your hospital stay. If you have any questions about your discharge medications or the care you received while you were in the hospital after you are discharged, you can call the unit and asked to speak with the hospitalist on call if the hospitalist that took care of you is not available. Once you are discharged, your primary care physician will handle any further medical issues. Please note that NO REFILLS for any discharge medications will  be authorized once you are discharged, as it is imperative that you return to your primary care physician (or establish a relationship with a primary care physician if you do not have one) for your aftercare needs so that they can reassess your need for medications and monitor your lab values. Today   SUBJECTIVE   Doing well  VITAL SIGNS:  Blood pressure 99/56, pulse  88, temperature 98.2 F (36.8 C), temperature source Oral, resp. rate 18, height 5\' 3"  (1.6 m), weight 88.451 kg (195 lb), SpO2 93 %.  I/O:   Intake/Output Summary (Last 24 hours) at 03/04/15 0919 Last data filed at 03/04/15 0500  Gross per 24 hour  Intake    821 ml  Output   3525 ml  Net  -2704 ml    PHYSICAL EXAMINATION:  GENERAL:  50 y.o.-year-old patient lying in the bed with no acute distress.  EYES: Pupils equal, round, reactive to light and accommodation. No scleral icterus. Extraocular muscles intact.  HEENT: Head atraumatic, normocephalic. Oropharynx and nasopharynx clear.  NECK:  Supple, no jugular venous distention. No thyroid enlargement, no tenderness.  LUNGS: Normal breath sounds bilaterally, no wheezing, rales,rhonchi or crepitation. No use of accessory muscles of respiration.  CARDIOVASCULAR: S1, S2 normal. No murmurs, rubs, or gallops.  ABDOMEN: Soft, non-tender, non-distended. Bowel sounds present. No organomegaly or mass.  EXTREMITIES: No pedal edema, cyanosis, or clubbing.  NEUROLOGIC: Cranial nerves II through XII are intact. Muscle strength 5/5 in all extremities. Sensation intact. Gait not checked.  PSYCHIATRIC: The patient is alert and oriented x 3.  SKIN: No obvious rash, lesion, or ulcer.   DATA REVIEW:   CBC   Recent Labs Lab 03/04/15 0554  WBC 14.2*  HGB 11.1*  HCT 33.4*  PLT 219    Chemistries   Recent Labs Lab 03/02/15 2233 03/03/15 0339  NA 137 140  K 4.1 3.7  CL 105 111  CO2 21* 22  GLUCOSE 99 110*  BUN 9 7  CREATININE 1.11* 0.95  CALCIUM 9.1 8.0*  AST 14*  --   ALT 12*  --   ALKPHOS 90  --   BILITOT 0.9  --     Microbiology Results   Recent Results (from the past 240 hour(s))  Culture, blood (routine x 2)     Status: None (Preliminary result)   Collection Time: 03/02/15 10:36 PM  Result Value Ref Range Status   Specimen Description BLOOD RIGHT ASSIST CONTROL  Final   Special Requests BOTTLES DRAWN AEROBIC AND ANAEROBIC  1CCANA,1CCAERO  Final   Culture NO GROWTH 2 DAYS  Final   Report Status PENDING  Incomplete  Culture, blood (routine x 2)     Status: None (Preliminary result)   Collection Time: 03/02/15 10:50 PM  Result Value Ref Range Status   Specimen Description BLOOD LEFT HAND  Final   Special Requests BOTTLES DRAWN AEROBIC AND ANAEROBIC 1CCANA,1CCAERO  Final   Culture NO GROWTH 2 DAYS  Final   Report Status PENDING  Incomplete  Urine culture     Status: None (Preliminary result)   Collection Time: 03/03/15 12:03 AM  Result Value Ref Range Status   Specimen Description URINE, RANDOM  Final   Special Requests Normal  Final   Culture NO GROWTH < 24 HOURS  Final   Report Status PENDING  Incomplete  Culture, sputum-assessment     Status: None   Collection Time: 03/04/15  5:31 AM  Result Value Ref Range Status   Specimen Description SPUTUM  Final   Special Requests NONE  Final   Sputum evaluation THIS SPECIMEN IS ACCEPTABLE FOR SPUTUM CULTURE  Final   Report Status 03/04/2015 FINAL  Final    RADIOLOGY:  Dg Chest 2 View  03/02/2015  CLINICAL DATA:  50 year old female with cough and fever and back pain. Chest radiograph dated 05/11/2012 EXAM: CHEST  2 VIEW COMPARISON:  None. FINDINGS: Two views of the chest demonstrates a focal area of increased density in the right upper lobe. Left suprahilar opacity is also noted. Left lung base linear density may represent atelectasis/ pneumonia. No significant pleural effusion. There is no pneumothorax. The cardiac silhouette is within normal limits. The osseous structures appear unremarkable. IMPRESSION: Right upper lobe and left suprahilar opacities concerning for pneumonia. Clinical correlation and follow-up recommended. Electronically Signed   By: Elgie Collard M.D.   On: 03/02/2015 23:42     Management plans discussed with the patient, family and they are in agreement.  CODE STATUS:     Code Status Orders        Start     Ordered   03/03/15 0225   Full code   Continuous     03/03/15 0224      TOTAL TIME TAKING CARE OF THIS PATIENT: 40 minutes.    Raynor Calcaterra M.D on 03/04/2015 at 9:19 AM  Between 7am to 6pm - Pager - [phone] After 6pm go to [email or web address] - password [email or web address] [facility]  [facility]  Office  [phone]  CC: Primary care physician; No PCP Per Patient

## 2015-03-04 NOTE — Progress Notes (Signed)
Patient discharge teaching given, including activity, diet, follow-up appoints, and medications. Patient verbalized understanding of all discharge instructions. IV access was d/c'd. Vitals are stable. Skin is intact except as charted in most recent assessments. Pt to be escorted out by volunteer, to be driven home by family.  Dierre Crevier E Hobbs  

## 2015-03-06 LAB — CULTURE, RESPIRATORY: CULTURE: NORMAL

## 2015-03-06 LAB — CULTURE, RESPIRATORY W GRAM STAIN

## 2015-03-07 LAB — MISC LABCORP TEST (SEND OUT): Labcorp test code: 9985

## 2015-03-07 LAB — CULTURE, BLOOD (ROUTINE X 2)
CULTURE: NO GROWTH
CULTURE: NO GROWTH

## 2016-05-28 ENCOUNTER — Encounter: Payer: Self-pay | Admitting: Emergency Medicine

## 2016-05-28 ENCOUNTER — Emergency Department
Admission: EM | Admit: 2016-05-28 | Discharge: 2016-05-28 | Disposition: A | Discharge status: Home / Self Care | Payer: Medicare Other | Attending: Emergency Medicine | Admitting: Emergency Medicine

## 2016-05-28 DIAGNOSIS — J45909 Unspecified asthma, uncomplicated: Secondary | ICD-10-CM | POA: Insufficient documentation

## 2016-05-28 DIAGNOSIS — J020 Streptococcal pharyngitis: Secondary | ICD-10-CM | POA: Insufficient documentation

## 2016-05-28 DIAGNOSIS — F17200 Nicotine dependence, unspecified, uncomplicated: Secondary | ICD-10-CM | POA: Insufficient documentation

## 2016-05-28 DIAGNOSIS — J029 Acute pharyngitis, unspecified: Secondary | ICD-10-CM | POA: Diagnosis present

## 2016-05-28 MED ORDER — PENICILLIN G BENZATHINE 1200000 UNIT/2ML IM SUSP
1.2000 10*6.[IU] | Freq: Once | INTRAMUSCULAR | Status: AC
  Administered 2016-05-28: 1.2 10*6.[IU] via INTRAMUSCULAR
  Filled 2016-05-28: qty 2

## 2016-05-28 MED ORDER — MAGIC MOUTHWASH
10.0000 mL | Freq: Once | ORAL | Status: AC
  Administered 2016-05-28: 10 mL via ORAL
  Filled 2016-05-28: qty 10

## 2016-05-28 MED ORDER — ONDANSETRON 4 MG PO TBDP
4.0000 mg | ORAL_TABLET | Freq: Once | ORAL | Status: AC
  Administered 2016-05-28: 4 mg via ORAL

## 2016-05-28 MED ORDER — ONDANSETRON 4 MG PO TBDP
ORAL_TABLET | ORAL | Status: AC
  Administered 2016-05-28: 4 mg via ORAL
  Filled 2016-05-28: qty 1

## 2016-05-28 MED ORDER — MAGIC MOUTHWASH: 5.0000 mL | Freq: Three times a day (TID) | ORAL | 0 refills | Status: AC | PRN

## 2016-05-28 NOTE — Discharge Instructions (Signed)
You have been treated with a one time injection of antibiotics for your strep throat. You may take Magic mouthwash as needed for throat discomfort. Return to the ER for worsening symptoms, persistent vomiting, difficulty breathing or other concerns.

## 2016-05-28 NOTE — ED Notes (Signed)
Strep test positive

## 2016-05-28 NOTE — ED Notes (Signed)
Reviewed d/c instructions, follow-up care, prescription with patient. Pt verbalized understanding.  

## 2016-05-28 NOTE — ED Notes (Signed)
Called pharmacy to request medication 

## 2016-05-28 NOTE — ED Provider Notes (Signed)
[facility] Emergency Department Provider Note   ____________________________________________   First MD Initiated Contact with Patient 05/28/16 [phone]     (approximate)  I have reviewed the triage vital signs and the nursing notes.   HISTORY  Chief Complaint Sore Throat    HPI Cindy Carrillo is a 51 y.o. female who presents to the ED from home with a chief complaint of sore throat. Patient reports her grandson had strep throat 2 weeks ago and she thinks she also has strep throat. Currently also have a nonproductive cough and congestion for which she is taking Mucinex. Denies fever, chills, chest pain, shortness of breath, abdominal pain, nausea, vomiting, diarrhea. Denies recent travel or trauma. Nothing makes her symptoms better or worse.   Past Medical History:  Diagnosis Date  . Asthma     Patient Active Problem List   Diagnosis Date Noted  . Sepsis (HCC) 03/03/2015  . CAP (community acquired pneumonia) 03/03/2015  . Leukocytosis 03/03/2015  . Asthma 03/03/2015    Past Surgical History:  Procedure Laterality Date  . NO PAST SURGERIES      Prior to Admission medications   Medication Sig Start Date End Date Taking? Authorizing Provider  albuterol (PROVENTIL HFA;VENTOLIN HFA) 108 (90 Base) MCG/ACT inhaler Inhale 2 puffs into the lungs every 6 (six) hours as needed for wheezing or shortness of breath. 03/04/15   Enedina Finner, MD  azithromycin (ZITHROMAX) 250 MG tablet Take 1 tab daily 03/04/15   Enedina Finner, MD  cephALEXin (KEFLEX) 500 MG capsule Take 1 capsule (500 mg total) by mouth every 12 (twelve) hours. 03/04/15   Enedina Finner, MD  guaiFENesin-dextromethorphan (ROBITUSSIN DM) 100-10 MG/5ML syrup Take 5 mLs by mouth every 4 (four) hours as needed for cough. 03/04/15   Enedina Finner, MD  magic mouthwash SOLN Take 5 mLs by mouth 3 (three) times daily as needed for mouth pain. 05/28/16   Irean Hong, MD  traMADol (ULTRAM) 50 MG tablet Take 1 tablet  (50 mg total) by mouth every 8 (eight) hours as needed for moderate pain. 03/04/15   Enedina Finner, MD    Allergies Vicodin [hydrocodone-acetaminophen] and Percocet [oxycodone-acetaminophen]  Family History  Problem Relation Age of Onset  . Family history unknown: Yes    Social History Social History  Substance Use Topics  . Smoking status: Current Every Day Smoker    Packs/day: 0.50  . Smokeless tobacco: Never Used  . Alcohol use No    Review of Systems  Constitutional: No fever/chills.  Eyes: No visual changes. ENT: Positive for sore throat. Positive for nasal congestion. Cardiovascular: Denies chest pain. Respiratory: Positive for cough. Denies shortness of breath. Gastrointestinal: No abdominal pain.  No nausea, no vomiting.  No diarrhea.  No constipation. Genitourinary: Negative for dysuria. Musculoskeletal: Negative for back pain. Skin: Negative for rash. Neurological: Negative for headaches, focal weakness or numbness.  10-point ROS otherwise negative.  ____________________________________________   PHYSICAL EXAM:  VITAL SIGNS: ED Triage Vitals [05/28/16 0410]  Enc Vitals Group     BP (!) 150/89     Pulse Rate 96     Resp 18     Temp 98.3 F (36.8 C)     Temp Source Oral     SpO2 99 %     Weight 180 lb (81.6 kg)     Height  (1.549 m)     Head Circumference      Peak Flow      Pain Score 10  Pain Loc      Pain Edu?      Excl. in GC?     Constitutional: Asleep, awakened for exam. Alert and oriented. Well appearing and in no acute distress. Eyes: Conjunctivae are normal. PERRL. EOMI. Head: Atraumatic. Nose: Congestion/rhinnorhea. Mouth/Throat: Mucous membranes are moist.  Oropharynx erythematous without tonsillar swelling, exudates or peritonsillar abscess. There is no hoarse or muffled voice. There is no drooling. Neck: No stridor.  Supple neck without meningismus. Hematological/Lymphatic/Immunilogical: Shotty anterior cervical  lymphadenopathy. Cardiovascular: Normal rate, regular rhythm. Grossly normal heart sounds.  Good peripheral circulation. Respiratory: Normal respiratory effort.  No retractions. Lungs CTAB. Gastrointestinal: Soft and nontender. No distention. No abdominal bruits. No CVA tenderness. Musculoskeletal: No lower extremity tenderness nor edema.  No joint effusions. Neurologic:  Normal speech and language. No gross focal neurologic deficits are appreciated. No gait instability. Skin:  Skin is warm, dry and intact. No rash noted. Psychiatric: Mood and affect are normal. Speech and behavior are normal.  ____________________________________________   LABS (all labs ordered are listed, but only abnormal results are displayed)  Labs Reviewed - No data to display ____________________________________________  EKG  None ____________________________________________  RADIOLOGY  None ____________________________________________   PROCEDURES  Procedure(s) performed: None  Procedures  Critical Care performed: No  ____________________________________________   INITIAL IMPRESSION / ASSESSMENT AND PLAN / ED COURSE  Pertinent labs & imaging results that were available during my care of the patient were reviewed by me and considered in my medical decision making (see chart for details).  51 year old female who presents with sore throat; recent exposure to strep. Rapid strep is positive. Patient opts for Bicillin injection. Will also administer Magic mouthwash for throat discomfort. Strict return precautions given. Patient verbalizes understanding and agrees with plan of care.      ____________________________________________   FINAL CLINICAL IMPRESSION(S) / ED DIAGNOSES  Final diagnoses:  Strep throat      NEW MEDICATIONS STARTED DURING THIS VISIT:  New Prescriptions   MAGIC MOUTHWASH SOLN    Take 5 mLs by mouth 3 (three) times daily as needed for mouth pain.     Note:  This  document was prepared using Dragon voice recognition software and may include unintentional dictation errors.    Irean Hong, MD 05/28/16 [phone]

## 2016-05-28 NOTE — ED Triage Notes (Signed)
Patient ambulatory to triage with steady gait, without difficulty or distress noted; pt reports sore throat tonight; st recent exposure to strep; currently taking mucinex for cough & congestion

## 2016-05-29 LAB — POCT RAPID STREP A: STREPTOCOCCUS, GROUP A SCREEN (DIRECT): POSITIVE — AB

## 2017-01-08 IMAGING — CR DG CHEST 2V
2 series · 2 of 2 positions shown · non-contrast
Comparison: None.

CLINICAL DATA: 49-year-old female with cough and fever and back
pain. Chest radiograph dated 05/11/2012

EXAM:
CHEST  2 VIEW

[chest pa]
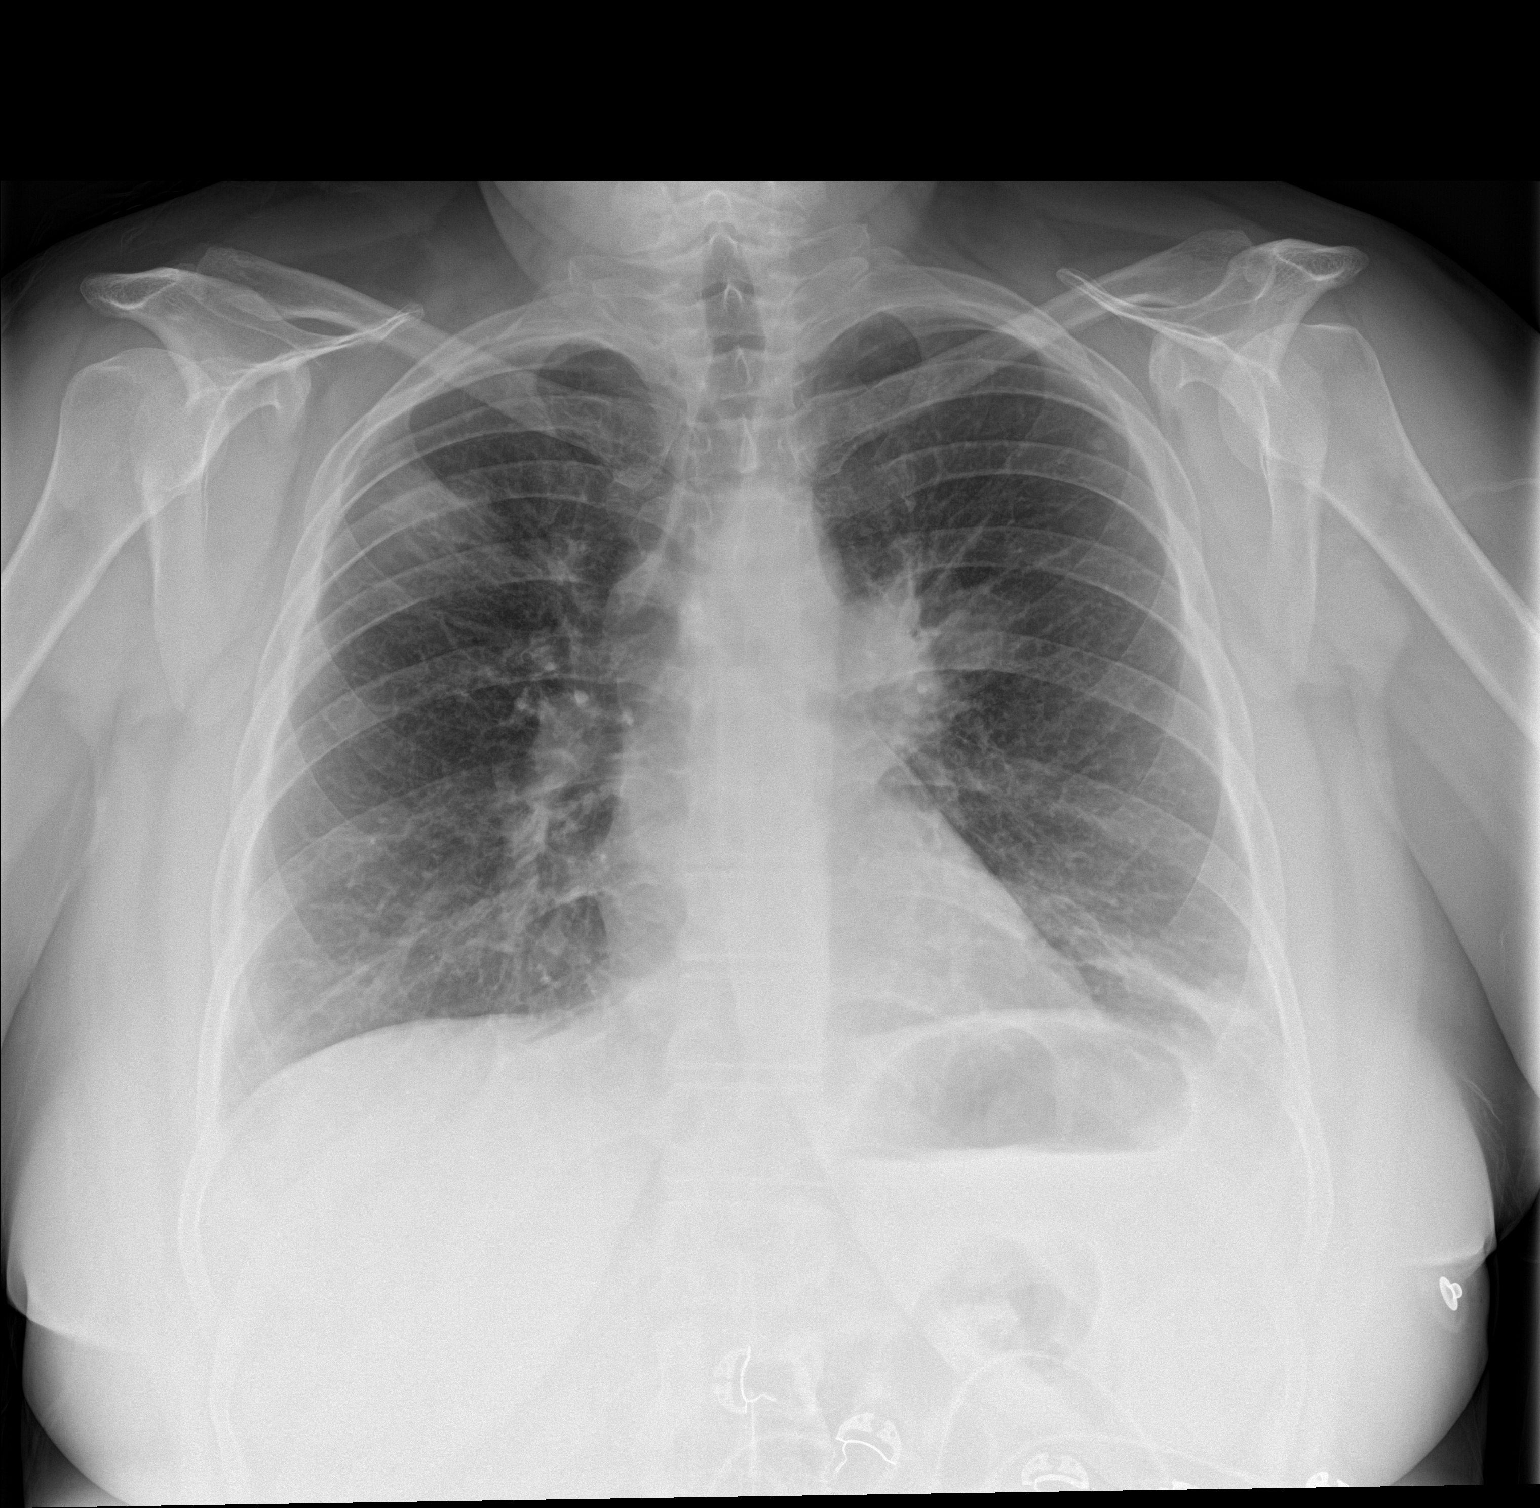

[chest lat]
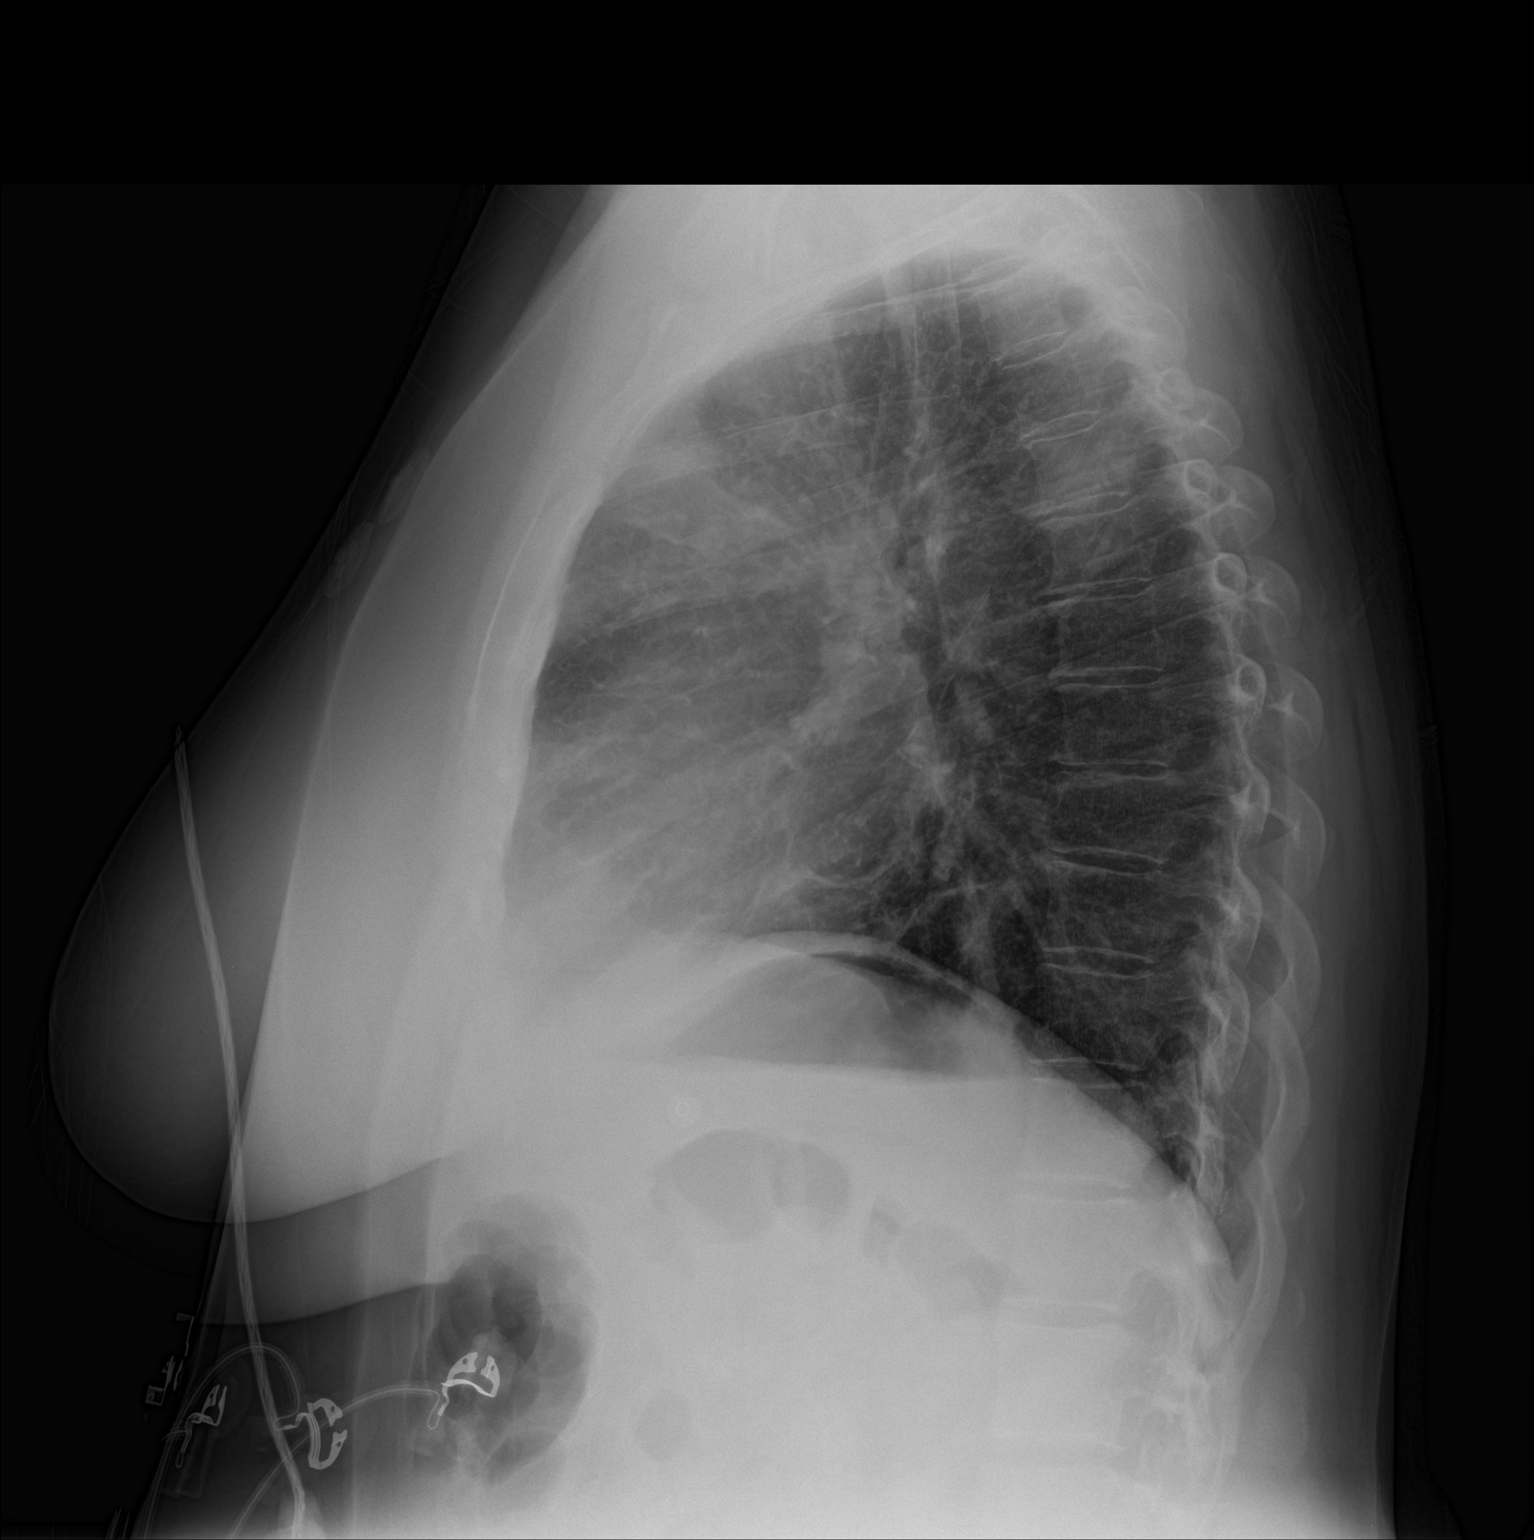

[2 of 2 positions shown; findings below may reference images not displayed]

FINDINGS: Two views of the chest demonstrates a focal area of increased
density in the right upper lobe. Left suprahilar opacity is also
noted. Left lung base linear density may represent atelectasis/
pneumonia. No significant pleural effusion. There is no
pneumothorax. The cardiac silhouette is within normal limits. The
osseous structures appear unremarkable.
IMPRESSION: Right upper lobe and left suprahilar opacities concerning for
pneumonia. Clinical correlation and follow-up recommended.

## 2017-04-18 ENCOUNTER — Emergency Department
Admission: EM | Admit: 2017-04-18 | Discharge: 2017-04-18 | Disposition: A | Discharge status: Home / Self Care | Payer: Medicare Other | Attending: Emergency Medicine | Admitting: Emergency Medicine

## 2017-04-18 ENCOUNTER — Encounter: Payer: Self-pay | Admitting: Emergency Medicine

## 2017-04-18 ENCOUNTER — Other Ambulatory Visit: Payer: Self-pay

## 2017-04-18 ENCOUNTER — Emergency Department: Payer: Medicare Other

## 2017-04-18 DIAGNOSIS — J3489 Other specified disorders of nose and nasal sinuses: Secondary | ICD-10-CM | POA: Insufficient documentation

## 2017-04-18 DIAGNOSIS — Z79899 Other long term (current) drug therapy: Secondary | ICD-10-CM | POA: Insufficient documentation

## 2017-04-18 DIAGNOSIS — R059 Cough, unspecified: Secondary | ICD-10-CM

## 2017-04-18 DIAGNOSIS — J069 Acute upper respiratory infection, unspecified: Secondary | ICD-10-CM | POA: Diagnosis not present

## 2017-04-18 DIAGNOSIS — B9789 Other viral agents as the cause of diseases classified elsewhere: Secondary | ICD-10-CM | POA: Insufficient documentation

## 2017-04-18 DIAGNOSIS — J45901 Unspecified asthma with (acute) exacerbation: Secondary | ICD-10-CM | POA: Diagnosis not present

## 2017-04-18 DIAGNOSIS — R05 Cough: Secondary | ICD-10-CM | POA: Insufficient documentation

## 2017-04-18 DIAGNOSIS — F17200 Nicotine dependence, unspecified, uncomplicated: Secondary | ICD-10-CM | POA: Insufficient documentation

## 2017-04-18 MED ORDER — METHYLPREDNISOLONE SODIUM SUCC 125 MG IJ SOLR
125.0000 mg | Freq: Once | INTRAMUSCULAR | Status: AC
  Administered 2017-04-18: 125 mg via INTRAMUSCULAR
  Filled 2017-04-18: qty 2

## 2017-04-18 MED ORDER — PREDNISONE 20 MG PO TABS: 40.0000 mg | ORAL_TABLET | Freq: Every day | ORAL | 0 refills | Status: AC

## 2017-04-18 MED ORDER — BENZONATATE 100 MG PO CAPS: 100.0000 mg | ORAL_CAPSULE | Freq: Four times a day (QID) | ORAL | 0 refills | Status: AC | PRN

## 2017-04-18 MED ORDER — IPRATROPIUM-ALBUTEROL 0.5-2.5 (3) MG/3ML IN SOLN
3.0000 mL | Freq: Once | RESPIRATORY_TRACT | Status: AC
  Administered 2017-04-18: 3 mL via RESPIRATORY_TRACT
  Filled 2017-04-18: qty 3

## 2017-04-18 MED ORDER — ALBUTEROL SULFATE HFA 108 (90 BASE) MCG/ACT IN AERS: 2.0000 | INHALATION_SPRAY | Freq: Four times a day (QID) | RESPIRATORY_TRACT | 2 refills | Status: AC | PRN

## 2017-04-18 NOTE — ED Triage Notes (Addendum)
Cough x 3 days. Runny nose at first. Now states feels like she has chest congestion.

## 2017-04-18 NOTE — Discharge Instructions (Signed)
Please take medications as prescribed.  Return to the ER for any shortness of breath, fevers, worsening symptoms or urgent changes in your health.

## 2017-04-18 NOTE — ED Notes (Signed)
Pt discharged to home.  Family member driving.  Discharge instructions reviewed.  Verbalized understanding.  No questions or concerns at this time.  Teach back verified.  Pt in NAD.  No items left in ED.   

## 2017-04-18 NOTE — ED Provider Notes (Signed)
[facility] EMERGENCY DEPARTMENT Provider Note   CSN: [id number] Arrival date & time: 04/18/17  1832     History   Chief Complaint Chief Complaint  Patient presents with  . Cough    HPI Cindy Carrillo is a 52 y.o. female presents to the emergency department for evaluation of cough.  Cough is been present for 3 days.  She is been taking Mucinex without much improvement.  She describes some mild shortness of breath, wheezing.  She has not been using albuterol.  Has a history of asthma in the past.  She denies any productive cough, fevers, chest pain, abdominal pain, rashes nausea vomiting or sore throat.  HPI  Past Medical History:  Diagnosis Date  . Asthma     Patient Active Problem List   Diagnosis Date Noted  . Sepsis (HCC) 03/03/2015  . CAP (community acquired pneumonia) 03/03/2015  . Leukocytosis 03/03/2015  . Asthma 03/03/2015    Past Surgical History:  Procedure Laterality Date  . NO PAST SURGERIES      OB History    No data available       Home Medications    Prior to Admission medications   Medication Sig Start Date End Date Taking? Authorizing Provider  albuterol (PROVENTIL HFA;VENTOLIN HFA) 108 (90 Base) MCG/ACT inhaler Inhale 2 puffs into the lungs every 6 (six) hours as needed for wheezing or shortness of breath. 04/18/17   Evon SlackGaines, Rafia Shedden C, PA-C  azithromycin (ZITHROMAX) 250 MG tablet Take 1 tab daily 03/04/15   Enedina FinnerPatel, Sona, MD  benzonatate (TESSALON PERLES) 100 MG capsule Take 1 capsule (100 mg total) by mouth every 6 (six) hours as needed for cough. 04/18/17 04/18/18  Evon SlackGaines, Tanina Barb C, PA-C  cephALEXin (KEFLEX) 500 MG capsule Take 1 capsule (500 mg total) by mouth every 12 (twelve) hours. 03/04/15   Enedina FinnerPatel, Sona, MD  guaiFENesin-dextromethorphan [facility](ROBITUSSIN DM) 100-10 MG/5ML syrup Take 5 mLs by mouth every 4 (four) hours as needed for cough. 03/04/15   Enedina FinnerPatel, Sona, MD  magic mouthwash SOLN Take 5 mLs by mouth 3 (three) times  daily as needed for mouth pain. 05/28/16   Irean HongSung, Jade J, MD  predniSONE (DELTASONE) 20 MG tablet Take 2 tablets (40 mg total) by mouth daily. 04/18/17   Evon SlackGaines, Laprecious Austill C, PA-C  traMADol (ULTRAM) 50 MG tablet Take 1 tablet (50 mg total) by mouth every 8 (eight) hours as needed for moderate pain. 03/04/15   Enedina FinnerPatel, Sona, MD    Family History Family History  Family history unknown: Yes    Social History Social History   Tobacco Use  . Smoking status: Current Every Day Smoker    Packs/day: 0.50  . Smokeless tobacco: Never Used  Substance Use Topics  . Alcohol use: No    Alcohol/week: 0.0 oz  . Drug use: No     Allergies   Vicodin [hydrocodone-acetaminophen] and Percocet [oxycodone-acetaminophen]   Review of Systems Review of Systems  Constitutional: Negative for fever.  HENT: Positive for congestion and rhinorrhea. Negative for ear discharge, sinus pressure, sinus pain, sore throat, trouble swallowing and voice change.   Respiratory: Positive for cough and wheezing. Negative for shortness of breath and stridor.   Cardiovascular: Negative for chest pain.  Gastrointestinal: Negative for abdominal pain, diarrhea, nausea and vomiting.  Genitourinary: Negative for dysuria, flank pain and pelvic pain.  Musculoskeletal: Positive for myalgias. Negative for back pain.  Skin: Negative for rash.  Neurological: Negative for dizziness and headaches.  Physical Exam Updated Vital Signs BP (!) 148/79   Pulse 88   Temp 98.3 F (36.8 C) (Oral)   Resp 18   Ht 5\' 1"  (1.549 m)   Wt 81.6 kg (180 lb)   SpO2 96%   BMI 34.01 kg/m   Physical Exam  Constitutional: She is oriented to person, place, and time. She appears well-developed and well-nourished. No distress.  HENT:  Head: Normocephalic and atraumatic.  Right Ear: Hearing, tympanic membrane, external ear and ear canal normal.  Left Ear: Hearing, tympanic membrane, external ear and ear canal normal.  Nose: Rhinorrhea present.    Mouth/Throat: Mucous membranes are normal. No trismus in the jaw. No uvula swelling. Posterior oropharyngeal erythema present. No oropharyngeal exudate, posterior oropharyngeal edema or tonsillar abscesses. No tonsillar exudate.  Eyes: Conjunctivae are normal. Right eye exhibits no discharge. Left eye exhibits no discharge.  Neck: Normal range of motion.  Cardiovascular: Normal rate and regular rhythm.  Pulmonary/Chest: Effort normal. No stridor. No respiratory distress. She has wheezes. She has no rales.  No signs of respiratory distress.  Slight decrease in air movement bilaterally  Abdominal: Soft. She exhibits no distension. There is no tenderness.  Musculoskeletal: Normal range of motion. She exhibits no deformity.  Lymphadenopathy:    She has no cervical adenopathy.  Neurological: She is alert and oriented to person, place, and time. She has normal reflexes.  Skin: Skin is warm and dry.  Psychiatric: She has a normal mood and affect. Her behavior is normal. Thought content normal.     ED Treatments / Results  Labs (all labs ordered are listed, but only abnormal results are displayed) Labs Reviewed - No data to display  EKG  EKG Interpretation None       Radiology Dg Chest 2 View  Result Date: 04/18/2017 CLINICAL DATA:  Cough for 3 days.  Chest congestion. EXAM: CHEST  2 VIEW COMPARISON:  Chest x-ray dated 03/02/2015. FINDINGS: Heart size and mediastinal contours are within normal limits. Lungs are clear. No pleural effusion. No acute or suspicious osseous finding. IMPRESSION: No active cardiopulmonary disease. No evidence of pneumonia or pulmonary edema. Electronically Signed   By: Bary Richard M.D.   On: 04/18/2017 19:11    Procedures Procedures (including critical care time)  Medications Ordered in ED Medications  ipratropium-albuterol (DUONEB) 0.5-2.5 (3) MG/3ML nebulizer solution 3 mL (3 mLs Nebulization Given 04/18/17 2015)  methylPREDNISolone sodium succinate  (SOLU-MEDROL) 125 mg/2 mL injection 125 mg (125 mg Intramuscular Given 04/18/17 2015)  ipratropium-albuterol (DUONEB) 0.5-2.5 (3) MG/3ML nebulizer solution 3 mL (3 mLs Nebulization Given 04/18/17 2051)     Initial Impression / Assessment and Plan / ED Course  I have reviewed the triage vital signs and the nursing notes.  Pertinent labs & imaging results that were available during my care of the patient were reviewed by me and considered in my medical decision making (see chart for details).    52 year old female with viral upper respiratory infection with wheezing.  Chest x-ray showed no evidence of acute cardiopulmonary process.  Vital signs within normal limits, oxygen saturation slightly decreased.  She was found to have diffuse wheezing.  After 125 mg of Solu-Medrol IM, 2 DuoNeb treatments patient's wheezing has resolved.  She denied any shortness of breath.  She is moving air well and coughing had decreased.  Patient is educated on signs and symptoms return to the ED for.  She is given prednisone, cough medication along with albuterol. Final Clinical Impressions(s) / ED Diagnoses  Final diagnoses:  Cough  Viral URI with cough  Mild asthma with exacerbation, unspecified whether persistent    ED Discharge Orders        Ordered    albuterol (PROVENTIL HFA;VENTOLIN HFA) 108 (90 Base) MCG/ACT inhaler  Every 6 hours PRN     04/18/17 2110    predniSONE (DELTASONE) 20 MG tablet  Daily     04/18/17 2110    benzonatate (TESSALON PERLES) 100 MG capsule  Every 6 hours PRN     04/18/17 2110       Ronnette Juniper 04/18/17 2132    Sharyn Creamer, MD 04/18/17 2356

## 2017-05-15 ENCOUNTER — Emergency Department
Admission: EM | Admit: 2017-05-15 | Discharge: 2017-05-15 | Disposition: A | Discharge status: Home / Self Care | Payer: Medicare Other | Attending: Emergency Medicine | Admitting: Emergency Medicine

## 2017-05-15 ENCOUNTER — Other Ambulatory Visit: Payer: Self-pay

## 2017-05-15 DIAGNOSIS — Z79899 Other long term (current) drug therapy: Secondary | ICD-10-CM | POA: Insufficient documentation

## 2017-05-15 DIAGNOSIS — J020 Streptococcal pharyngitis: Secondary | ICD-10-CM | POA: Diagnosis not present

## 2017-05-15 DIAGNOSIS — J029 Acute pharyngitis, unspecified: Secondary | ICD-10-CM | POA: Diagnosis present

## 2017-05-15 DIAGNOSIS — J040 Acute laryngitis: Secondary | ICD-10-CM | POA: Diagnosis not present

## 2017-05-15 DIAGNOSIS — F17200 Nicotine dependence, unspecified, uncomplicated: Secondary | ICD-10-CM | POA: Diagnosis not present

## 2017-05-15 DIAGNOSIS — J45909 Unspecified asthma, uncomplicated: Secondary | ICD-10-CM | POA: Insufficient documentation

## 2017-05-15 LAB — GROUP A STREP BY PCR: Group A Strep by PCR: DETECTED — AB

## 2017-05-15 MED ORDER — MAGIC MOUTHWASH W/LIDOCAINE: 5.0000 mL | Freq: Four times a day (QID) | ORAL | 0 refills | Status: AC

## 2017-05-15 MED ORDER — AMOXICILLIN 500 MG PO CAPS
1000.0000 mg | ORAL_CAPSULE | Freq: Once | ORAL | Status: AC
  Administered 2017-05-15: 1000 mg via ORAL
  Filled 2017-05-15: qty 2

## 2017-05-15 MED ORDER — AMOXICILLIN 875 MG PO TABS: 875.0000 mg | ORAL_TABLET | Freq: Two times a day (BID) | ORAL | 0 refills | Status: AC

## 2017-05-15 NOTE — ED Provider Notes (Signed)
[facility] Emergency Department Provider Note  ____________________________________________  Time seen: Approximately 9:22 PM  I have reviewed the triage vital signs and the nursing notes.   HISTORY  Chief Complaint Sore Throat    HPI Cindy Carrillo is a 52 y.o. female who presents the emergency department complaining of sore throat times 1 week.  Patient denies any fevers or chills, nasal congestion, coughing.  She reports that she is "lost her voice."  Otherwise, no other symptoms.  Patient reports that the pain is bilateral, sharp and scratchy in nature.  Patient has been around multiple family members that have been sick but they have all been diagnosed with viral URI.  Patient felt that this was similar however her symptoms have lasted longer than previous family members.  No difficulty breathing or swallowing.  Patient denies any headache, visual changes, neck pain or stiffness, chest pain, shortness of breath, abdominal pain, nausea vomiting, diarrhea or constipation.  Patient is taking Mucinex which helps with her symptoms.  Past Medical History:  Diagnosis Date  . Asthma     Patient Active Problem List   Diagnosis Date Noted  . Sepsis (HCC) 03/03/2015  . CAP (community acquired pneumonia) 03/03/2015  . Leukocytosis 03/03/2015  . Asthma 03/03/2015    Past Surgical History:  Procedure Laterality Date  . NO PAST SURGERIES      Prior to Admission medications   Medication Sig Start Date End Date Taking? Authorizing Provider  albuterol (PROVENTIL HFA;VENTOLIN HFA) 108 (90 Base) MCG/ACT inhaler Inhale 2 puffs into the lungs every 6 (six) hours as needed for wheezing or shortness of breath. 04/18/17   Evon SlackGaines, Thomas C, PA-C  amoxicillin (AMOXIL) 875 MG tablet Take 1 tablet (875 mg total) by mouth 2 (two) times daily. 05/15/17   Cuthriell, Delorise RoyalsJonathan D, PA-C  azithromycin (ZITHROMAX) 250 MG tablet Take 1 tab daily 03/04/15   Enedina FinnerPatel, Sona, MD   benzonatate (TESSALON PERLES) 100 MG capsule Take 1 capsule (100 mg total) by mouth every 6 (six) hours as needed for cough. 04/18/17 04/18/18  Evon SlackGaines, Thomas C, PA-C  cephALEXin (KEFLEX) 500 MG capsule Take 1 capsule (500 mg total) by mouth every 12 (twelve) hours. 03/04/15   Enedina FinnerPatel, Sona, MD  guaiFENesin-dextromethorphan [facility](ROBITUSSIN DM) 100-10 MG/5ML syrup Take 5 mLs by mouth every 4 (four) hours as needed for cough. 03/04/15   Enedina FinnerPatel, Sona, MD  magic mouthwash SOLN Take 5 mLs by mouth 3 (three) times daily as needed for mouth pain. 05/28/16   Irean HongSung, Jade J, MD  magic mouthwash w/lidocaine SOLN Take 5 mLs by mouth 4 (four) times daily. 05/15/17   Cuthriell, Delorise RoyalsJonathan D, PA-C  predniSONE (DELTASONE) 20 MG tablet Take 2 tablets (40 mg total) by mouth daily. 04/18/17   Evon SlackGaines, Thomas C, PA-C  traMADol (ULTRAM) 50 MG tablet Take 1 tablet (50 mg total) by mouth every 8 (eight) hours as needed for moderate pain. 03/04/15   Enedina FinnerPatel, Sona, MD    Allergies Vicodin [hydrocodone-acetaminophen] and Percocet [oxycodone-acetaminophen]  Family History  Family history unknown: Yes    Social History Social History   Tobacco Use  . Smoking status: Current Every Day Smoker    Packs/day: 0.50  . Smokeless tobacco: Never Used  Substance Use Topics  . Alcohol use: No    Alcohol/week: 0.0 oz  . Drug use: No     Review of Systems  Constitutional: No fever/chills Eyes: No visual changes. No discharge ENT: Positive for sore throat Cardiovascular: no chest pain. Respiratory:  no cough. No SOB. Gastrointestinal: No abdominal pain.  No nausea, no vomiting.  No diarrhea.  No constipation. Musculoskeletal: Negative for musculoskeletal pain. Skin: Negative for rash, abrasions, lacerations, ecchymosis. Neurological: Negative for headaches, focal weakness or numbness. 10-point ROS otherwise negative.  ____________________________________________   PHYSICAL EXAM:  VITAL SIGNS: ED Triage Vitals [05/15/17 2024]  Enc  Vitals Group     BP 128/70     Pulse Rate 92     Resp 20     Temp 98.4 F (36.9 C)     Temp Source Oral     SpO2 100 %     Weight      Height      Head Circumference      Peak Flow      Pain Score 5     Pain Loc      Pain Edu?      Excl. in GC?      Constitutional: Alert and oriented. Well appearing and in no acute distress. Eyes: Conjunctivae are normal. PERRL. EOMI. Head: Atraumatic. ENT:      Ears:       Nose: No congestion/rhinnorhea.      Mouth/Throat: Mucous membranes are moist.  Oropharynx is mildly erythematous but not grossly edematous.  Uvula is midline.  Tonsils are erythematous bilaterally.  Edematous bilaterally.  No exudates.   Neck: No stridor.  Neck is supple full range of motion Hematological/Lymphatic/Immunilogical: Diffuse, mobile, nontender anterior cervical lymphadenopathy. Cardiovascular: Normal rate, regular rhythm. Normal S1 and S2.  Good peripheral circulation. Respiratory: Normal respiratory effort without tachypnea or retractions. Lungs CTAB. Good air entry to the bases with no decreased or absent breath sounds. Musculoskeletal: Full range of motion to all extremities. No gross deformities appreciated. Neurologic:  Normal speech and language. No gross focal neurologic deficits are appreciated.  Skin:  Skin is warm, dry and intact. No rash noted. Psychiatric: Mood and affect are normal. Speech and behavior are normal. Patient exhibits appropriate insight and judgement.   ____________________________________________   LABS (all labs ordered are listed, but only abnormal results are displayed)  Labs Reviewed  GROUP A STREP BY PCR - Abnormal; Notable for the following components:      Result Value   Group A Strep by PCR DETECTED (*)    All other components within normal limits   ____________________________________________  EKG   ____________________________________________  RADIOLOGY   No results  found.  ____________________________________________    PROCEDURES  Procedure(s) performed:    Procedures    Medications  amoxicillin (AMOXIL) capsule 1,000 mg (has no administration in time range)     ____________________________________________   INITIAL IMPRESSION / ASSESSMENT AND PLAN / ED COURSE  Pertinent labs & imaging results that were available during my care of the patient were reviewed by me and considered in my medical decision making (see chart for details).  Review of the [place] CSRS was performed in accordance of the [facility] prior to dispensing any controlled drugs.     Patient's diagnosis is consistent with strep throat and laryngitis.  Patient presents emergency department with a sore throat only.  Strep test was positive.  Initial differential included viral URI, laryngitis, viral pharyngitis, strep pharyngitis.  No indication for renal further labs or imaging.  Exam is reassuring.  Patient is given first dose of amoxicillin here in the emergency department.  Patient will be discharged home with prescriptions for amoxicillin and Magic mouthwash for symptom control.  Tylenol and Motrin at home as needed.. Patient  is to follow up with primary care as needed or otherwise directed. Patient is given ED precautions to return to the ED for any worsening or new symptoms.     ____________________________________________  FINAL CLINICAL IMPRESSION(S) / ED DIAGNOSES  Final diagnoses:  Strep throat  Laryngitis      NEW MEDICATIONS STARTED DURING THIS VISIT:  ED Discharge Orders        Ordered    amoxicillin (AMOXIL) 875 MG tablet  2 times daily     05/15/17 2126    magic mouthwash w/lidocaine SOLN  4 times daily    Note to Pharmacy:  Dispense in a 1/1/1 ratio. Use lidocaine, diphenhydramine, prednisolone   05/15/17 2126          This chart was dictated using voice recognition software/Dragon. Despite best efforts to proofread, errors can occur which can  change the meaning. Any change was purely unintentional.    Racheal Patches, PA-C 05/15/17 2128    Arnaldo Natal, MD 05/16/17 [phone]

## 2017-05-15 NOTE — ED Triage Notes (Signed)
Reports sore throat for 1 week.

## 2017-06-14 ENCOUNTER — Other Ambulatory Visit: Payer: Self-pay

## 2017-06-14 ENCOUNTER — Emergency Department: Payer: Medicare Other

## 2017-06-14 ENCOUNTER — Emergency Department
Admission: EM | Admit: 2017-06-14 | Discharge: 2017-06-14 | Disposition: A | Discharge status: Home / Self Care | Payer: Medicare Other | Attending: Emergency Medicine | Admitting: Emergency Medicine

## 2017-06-14 DIAGNOSIS — Y998 Other external cause status: Secondary | ICD-10-CM | POA: Diagnosis not present

## 2017-06-14 DIAGNOSIS — Y929 Unspecified place or not applicable: Secondary | ICD-10-CM | POA: Insufficient documentation

## 2017-06-14 DIAGNOSIS — F17210 Nicotine dependence, cigarettes, uncomplicated: Secondary | ICD-10-CM | POA: Diagnosis not present

## 2017-06-14 DIAGNOSIS — J45909 Unspecified asthma, uncomplicated: Secondary | ICD-10-CM | POA: Diagnosis not present

## 2017-06-14 DIAGNOSIS — W19XXXA Unspecified fall, initial encounter: Secondary | ICD-10-CM | POA: Insufficient documentation

## 2017-06-14 DIAGNOSIS — R2232 Localized swelling, mass and lump, left upper limb: Secondary | ICD-10-CM | POA: Insufficient documentation

## 2017-06-14 DIAGNOSIS — S63649A Sprain of metacarpophalangeal joint of unspecified thumb, initial encounter: Secondary | ICD-10-CM

## 2017-06-14 DIAGNOSIS — S6992XA Unspecified injury of left wrist, hand and finger(s), initial encounter: Secondary | ICD-10-CM | POA: Diagnosis present

## 2017-06-14 DIAGNOSIS — S63642A Sprain of metacarpophalangeal joint of left thumb, initial encounter: Secondary | ICD-10-CM | POA: Diagnosis not present

## 2017-06-14 DIAGNOSIS — Y9389 Activity, other specified: Secondary | ICD-10-CM | POA: Diagnosis not present

## 2017-06-14 MED ORDER — MELOXICAM 15 MG PO TABS: 15.0000 mg | ORAL_TABLET | Freq: Every day | ORAL | 1 refills | Status: AC

## 2017-06-14 NOTE — ED Provider Notes (Signed)
[facility] Emergency Department Provider Note  ____________________________________________  Time seen: Approximately 7:45 PM  I have reviewed the triage vital signs and the nursing notes.   HISTORY  Chief Complaint Hand Injury    HPI Cindy Carrillo is a 52 y.o. female presents to the emergency department with left hand pain worsened with abduction at the left thumb.  Patient reports that she fell while playing with one of her grandchildren's toys.  Patient denies weakness, radiculopathy or changes in sensation. Patient has noticed hand erythema and edema since the incident.  She currently rates her pain at 8 out of 10 in intensity.  No alleviating measures have been attempted.   Past Medical History:  Diagnosis Date  . Asthma     Patient Active Problem List   Diagnosis Date Noted  . Sepsis (HCC) 03/03/2015  . CAP (community acquired pneumonia) 03/03/2015  . Leukocytosis 03/03/2015  . Asthma 03/03/2015    Past Surgical History:  Procedure Laterality Date  . NO PAST SURGERIES      Prior to Admission medications   Medication Sig Start Date End Date Taking? Authorizing Provider  albuterol (PROVENTIL HFA;VENTOLIN HFA) 108 (90 Base) MCG/ACT inhaler Inhale 2 puffs into the lungs every 6 (six) hours as needed for wheezing or shortness of breath. 04/18/17   Cindy Carrillo, Thomas C, PA-C  amoxicillin (AMOXIL) 875 MG tablet Take 1 tablet (875 mg total) by mouth 2 (two) times daily. 05/15/17   Cuthriell, Cindy RoyalsJonathan D, PA-C  azithromycin (ZITHROMAX) 250 MG tablet Take 1 tab daily 03/04/15   Enedina FinnerPatel, Sona, MD  benzonatate (TESSALON PERLES) 100 MG capsule Take 1 capsule (100 mg total) by mouth every 6 (six) hours as needed for cough. 04/18/17 04/18/18  Cindy Carrillo, Thomas C, PA-C  cephALEXin (KEFLEX) 500 MG capsule Take 1 capsule (500 mg total) by mouth every 12 (twelve) hours. 03/04/15   Enedina FinnerPatel, Sona, MD  guaiFENesin-dextromethorphan Cindy Carrillo(ROBITUSSIN DM) 100-10 MG/5ML syrup Take 5  mLs by mouth every 4 (four) hours as needed for cough. 03/04/15   Enedina FinnerPatel, Sona, MD  magic mouthwash SOLN Take 5 mLs by mouth 3 (three) times daily as needed for mouth pain. 05/28/16   Cindy Carrillo, Cindy J, MD  magic mouthwash w/lidocaine SOLN Take 5 mLs by mouth 4 (four) times daily. 05/15/17   Cuthriell, Cindy RoyalsJonathan D, PA-C  meloxicam (MOBIC) 15 MG tablet Take 1 tablet (15 mg total) by mouth daily for 7 days. 06/14/17 06/21/17  Orvil FeilWoods, Cindy Bas M, PA-C  predniSONE (DELTASONE) 20 MG tablet Take 2 tablets (40 mg total) by mouth daily. 04/18/17   Cindy Carrillo, Thomas C, PA-C  traMADol (ULTRAM) 50 MG tablet Take 1 tablet (50 mg total) by mouth every 8 (eight) hours as needed for moderate pain. 03/04/15   Enedina FinnerPatel, Sona, MD    Allergies Vicodin [hydrocodone-acetaminophen] and Percocet [oxycodone-acetaminophen]  Family History  Family history unknown: Yes    Social History Social History   Tobacco Use  . Smoking status: Current Every Day Smoker    Packs/day: 0.50  . Smokeless tobacco: Never Used  Substance Use Topics  . Alcohol use: No    Alcohol/week: 0.0 oz  . Drug use: No     Review of Systems  Constitutional: No fever/chills Eyes: No visual changes. No discharge ENT: No upper respiratory complaints. Cardiovascular: no chest pain. Respiratory: no cough. No SOB. Gastrointestinal: No abdominal pain.  No nausea, no vomiting.  No diarrhea.  No constipation. Musculoskeletal: Patient has left hand pain.  Skin: Negative for rash, abrasions,  lacerations, ecchymosis. Neurological: Negative for headaches, focal weakness or numbness.   ____________________________________________   PHYSICAL EXAM:  VITAL SIGNS: ED Triage Vitals [06/14/17 1754]  Enc Vitals Group     BP (!) 159/98     Pulse Rate (!) 103     Resp 18     Temp 98.3 F (36.8 C)     Temp Source Oral     SpO2 96 %     Weight 180 lb (81.6 kg)     Height 5\' 3"  (1.6 Carrillo)     Head Circumference      Peak Flow      Pain Score 10     Pain Loc       Pain Edu?      Excl. in GC?      Constitutional: Alert and oriented. Well appearing and in no acute distress. Eyes: Conjunctivae are normal. PERRL. EOMI. Head: Atraumatic.  Cardiovascular: Normal rate, regular rhythm. Normal S1 and S2.  Good peripheral circulation. Respiratory: Normal respiratory effort without tachypnea or retractions. Lungs CTAB. Good air entry to the bases with no decreased or absent breath sounds. Gastrointestinal: Bowel sounds 4 quadrants. Soft and nontender to palpation. No guarding or rigidity. No palpable masses. No distention. No CVA tenderness. Musculoskeletal: Patient is able to move all 5 left fingers.  Tenderness is elicited with abduction at the left thumb.  Moderate edema and erythema visualized along the anatomical snuff box.  Palpable radial pulse, left. Neurologic:  Normal speech and language. No gross focal neurologic deficits are appreciated.  Skin:  Skin is warm, dry and intact. No rash noted. ____________________________________________   LABS (all labs ordered are listed, but only abnormal results are displayed)  Labs Reviewed - No data to display ____________________________________________  EKG   ____________________________________________  RADIOLOGY Cindy Carrillo, personally viewed and evaluated these images (plain radiographs) as part of my medical decision making, as well as reviewing the written report by the radiologist.  Dg Hand Complete Left  Result Date: 06/14/2017 CLINICAL DATA:  Left hand injury prior to arrival with left thumb pain. Patient fell. EXAM: LEFT HAND - COMPLETE 3+ VIEW COMPARISON:  None. FINDINGS: The fingers are held in slight flexion. No acute fracture or malalignment. No marginal nor extra articular erosions. No periostitis. Carpal rows are maintained. Soft tissue prominence of the hand and wrist likely related to patient body habitus. IMPRESSION: No acute fracture nor malalignment identified. Electronically  Signed   By: Tollie Eth Carrillo.Carrillo.   On: 06/14/2017 18:35    ____________________________________________    PROCEDURES  Procedure(s) performed:    Procedures    Medications - No data to display   ____________________________________________   INITIAL IMPRESSION / ASSESSMENT AND PLAN / ED COURSE  Pertinent labs & imaging results that were available during my care of the patient were reviewed by me and considered in my medical decision making (see chart for details).  Review of the [place] CSRS was performed in accordance of the [facility] prior to dispensing any controlled drugs.     Assessment and plan Gamekeeper's thumb Patient presents to the emergency department with left thumb pain after a fall.  Differential diagnosis included gamekeeper's thumb versus fracture.  History and physical exam findings are consistent with gamekeeper's thumb at this time.  Patient was splinted in the emergency department and discharged with meloxicam.  She was advised to follow-up with orthopedics as soon as possible.  All patient questions were answered.    ____________________________________________  FINAL  CLINICAL IMPRESSION(S) / ED DIAGNOSES  Final diagnoses:  Gamekeeper thumb      NEW MEDICATIONS STARTED DURING THIS VISIT:  ED Discharge Orders        Ordered    meloxicam (MOBIC) 15 MG tablet  Daily     06/14/17 1922          This chart was dictated using voice recognition software/Dragon. Despite best efforts to proofread, errors can occur which can change the meaning. Any change was purely unintentional.    Orvil Feil, PA-C 06/14/17 1953    Don Perking, [place], MD 06/15/17 [phone]

## 2017-06-14 NOTE — ED Triage Notes (Signed)
Left hand injury PTA, left thumb pain. Left leg abrasion. Pt alert and oriented X4, active, cooperative, pt in NAD. RR even and unlabored, color WNL.

## 2019-02-25 IMAGING — CR DG CHEST 2V
1 series · 2 of 2 positions shown · non-contrast
Comparison: Chest x-ray dated 03/02/2015.

CLINICAL DATA: Cough for 3 days.  Chest congestion.

EXAM:
CHEST  2 VIEW

[Series 1: dg chest 2 view · 0.14mm/px · 2 of 2 slices shown]
[im 1/2]
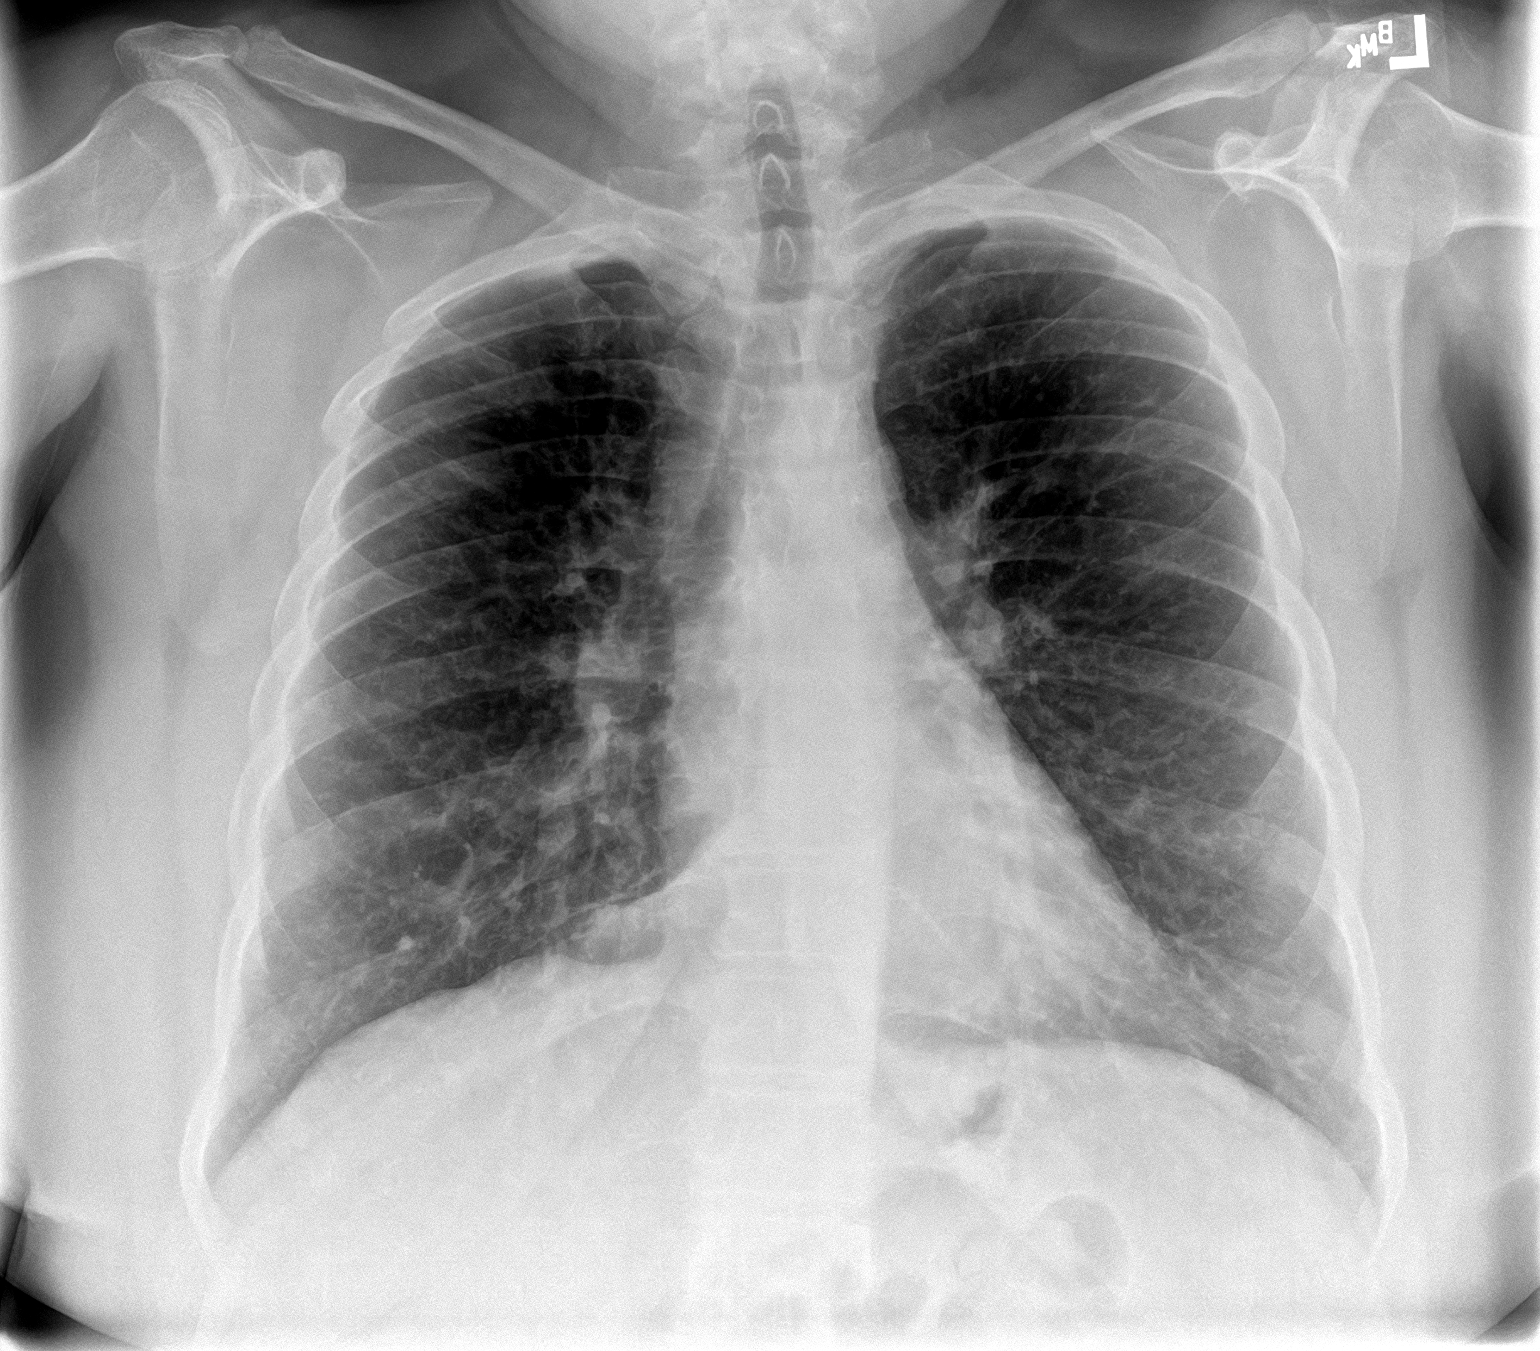
[im 2/2]
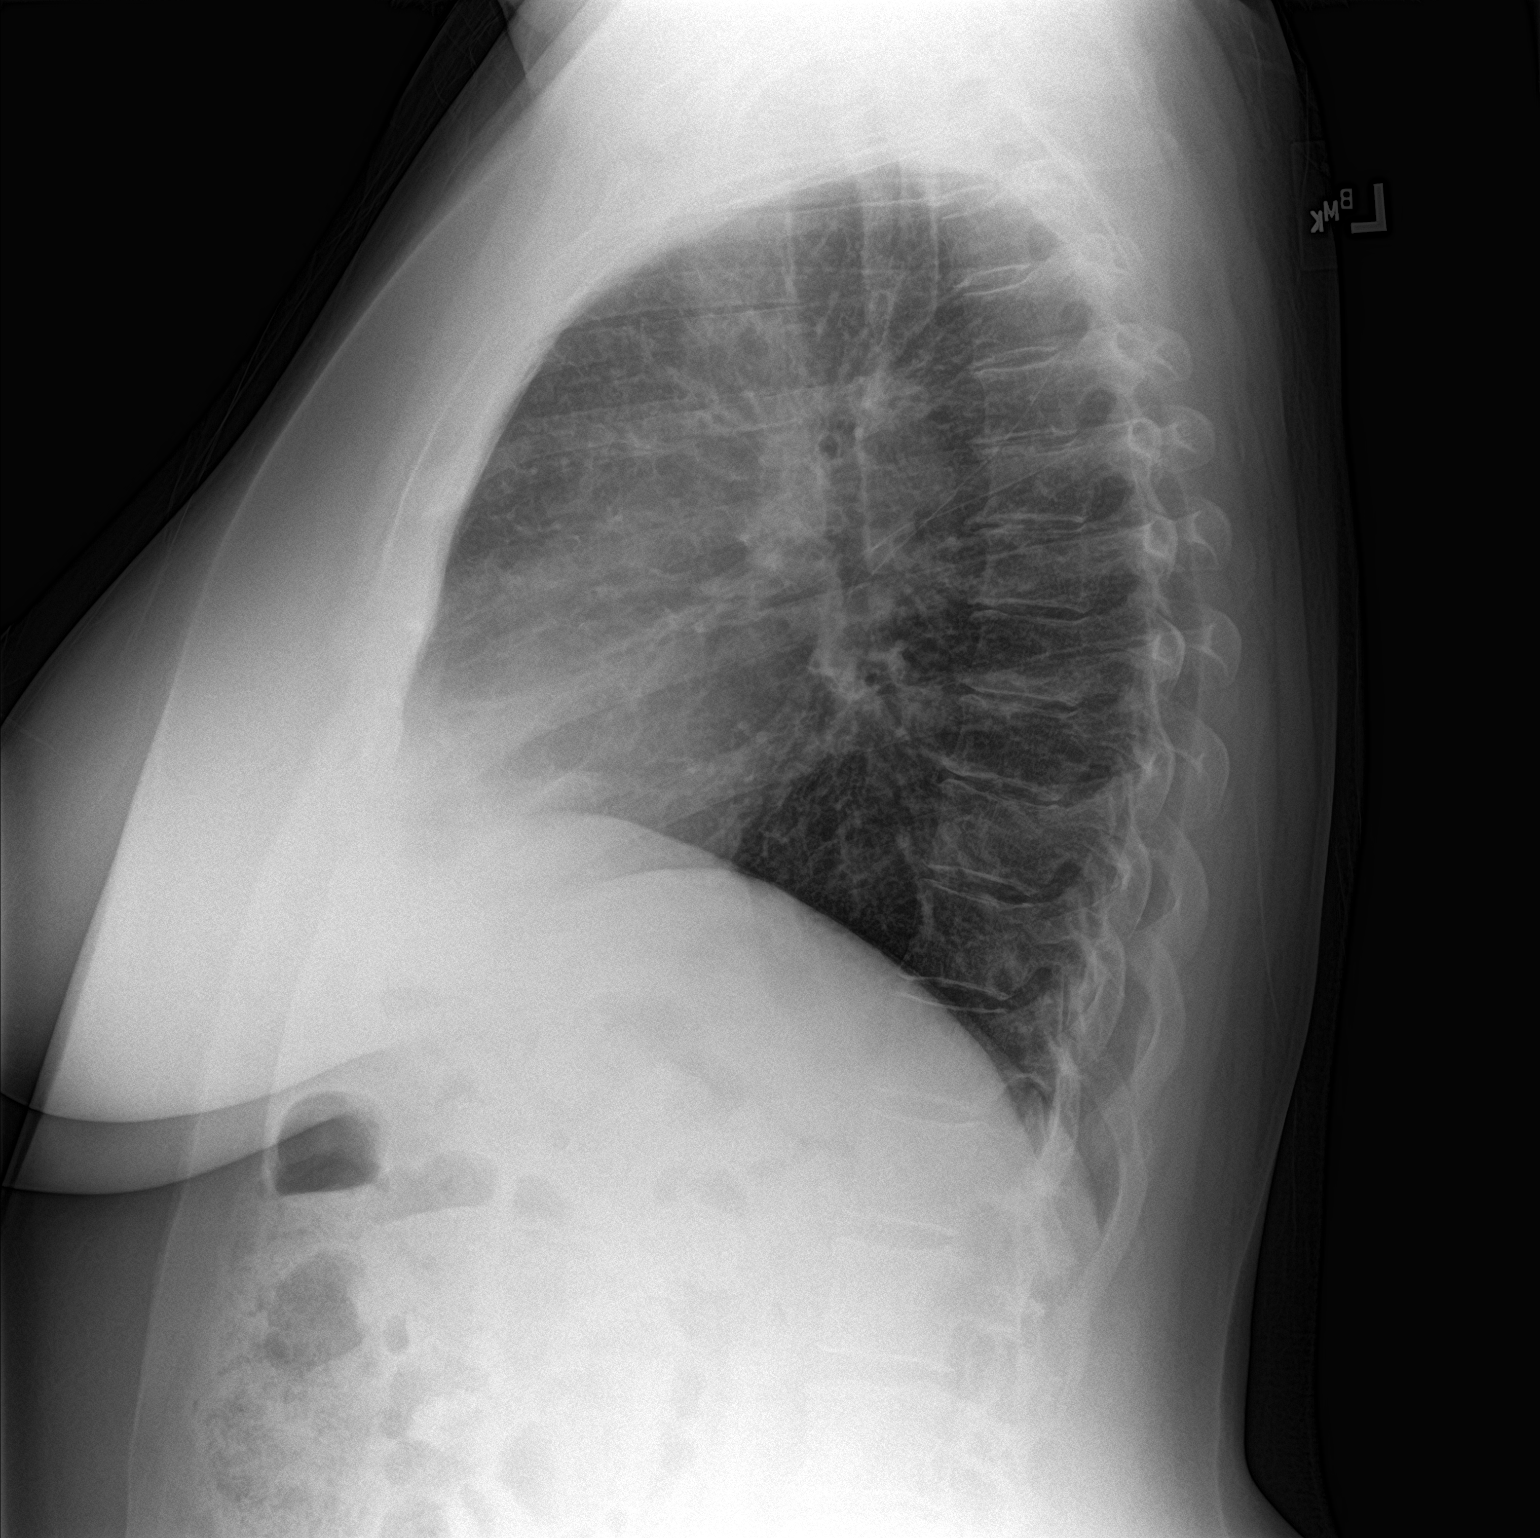

[2 of 2 positions shown; findings below may reference images not displayed]

FINDINGS: Heart size and mediastinal contours are within normal limits. Lungs
are clear. No pleural effusion. No acute or suspicious osseous
finding.
IMPRESSION: No active cardiopulmonary disease. No evidence of pneumonia or
pulmonary edema.
